# Patient Record
Sex: Male | Born: 1935 | Race: White | Hispanic: No | Marital: Married | Smoking: Never smoker
Health system: Southern US, Community
[De-identification: ages and names within clinical notes are randomized; demographics above are authoritative.]

## PROBLEM LIST (undated history)

## (undated) DIAGNOSIS — H409 Unspecified glaucoma: Secondary | ICD-10-CM

## (undated) DIAGNOSIS — M1990 Unspecified osteoarthritis, unspecified site: Secondary | ICD-10-CM

## (undated) DIAGNOSIS — D649 Anemia, unspecified: Secondary | ICD-10-CM

## (undated) DIAGNOSIS — C4490 Unspecified malignant neoplasm of skin, unspecified: Secondary | ICD-10-CM

## (undated) HISTORY — PX: SKIN SURGERY: SHX2413

## (undated) HISTORY — DX: Unspecified osteoarthritis, unspecified site: M19.90

## (undated) HISTORY — DX: Anemia, unspecified: D64.9

## (undated) HISTORY — DX: Unspecified glaucoma: H40.9

## (undated) HISTORY — PX: OTHER SURGICAL HISTORY: SHX169

## (undated) HISTORY — DX: Unspecified malignant neoplasm of skin, unspecified: C44.90

---

## 2015-04-22 DIAGNOSIS — Z23 Encounter for immunization: Secondary | ICD-10-CM | POA: Diagnosis not present

## 2015-05-01 DIAGNOSIS — M1711 Unilateral primary osteoarthritis, right knee: Secondary | ICD-10-CM | POA: Diagnosis not present

## 2015-05-06 DIAGNOSIS — M25561 Pain in right knee: Secondary | ICD-10-CM | POA: Diagnosis not present

## 2015-05-06 DIAGNOSIS — S8991XA Unspecified injury of right lower leg, initial encounter: Secondary | ICD-10-CM | POA: Diagnosis not present

## 2015-05-06 DIAGNOSIS — M2241 Chondromalacia patellae, right knee: Secondary | ICD-10-CM | POA: Diagnosis not present

## 2015-05-06 DIAGNOSIS — M1711 Unilateral primary osteoarthritis, right knee: Secondary | ICD-10-CM | POA: Diagnosis not present

## 2015-05-08 DIAGNOSIS — M1711 Unilateral primary osteoarthritis, right knee: Secondary | ICD-10-CM | POA: Diagnosis not present

## 2015-05-25 DIAGNOSIS — M1711 Unilateral primary osteoarthritis, right knee: Secondary | ICD-10-CM | POA: Diagnosis not present

## 2015-06-05 DIAGNOSIS — M1711 Unilateral primary osteoarthritis, right knee: Secondary | ICD-10-CM | POA: Diagnosis not present

## 2015-06-18 DIAGNOSIS — M1711 Unilateral primary osteoarthritis, right knee: Secondary | ICD-10-CM | POA: Diagnosis not present

## 2015-07-17 DIAGNOSIS — M1711 Unilateral primary osteoarthritis, right knee: Secondary | ICD-10-CM | POA: Diagnosis not present

## 2015-07-29 DIAGNOSIS — M5136 Other intervertebral disc degeneration, lumbar region: Secondary | ICD-10-CM | POA: Diagnosis not present

## 2015-07-29 DIAGNOSIS — M1612 Unilateral primary osteoarthritis, left hip: Secondary | ICD-10-CM | POA: Diagnosis not present

## 2015-07-29 DIAGNOSIS — R1030 Lower abdominal pain, unspecified: Secondary | ICD-10-CM | POA: Diagnosis not present

## 2015-07-29 DIAGNOSIS — M25552 Pain in left hip: Secondary | ICD-10-CM | POA: Diagnosis not present

## 2015-07-29 DIAGNOSIS — M5432 Sciatica, left side: Secondary | ICD-10-CM | POA: Diagnosis not present

## 2015-08-13 DIAGNOSIS — M1612 Unilateral primary osteoarthritis, left hip: Secondary | ICD-10-CM | POA: Diagnosis not present

## 2015-08-28 DIAGNOSIS — R1030 Lower abdominal pain, unspecified: Secondary | ICD-10-CM | POA: Diagnosis not present

## 2015-08-28 DIAGNOSIS — M1612 Unilateral primary osteoarthritis, left hip: Secondary | ICD-10-CM | POA: Diagnosis not present

## 2015-08-28 DIAGNOSIS — M25552 Pain in left hip: Secondary | ICD-10-CM | POA: Diagnosis not present

## 2015-08-28 DIAGNOSIS — M5136 Other intervertebral disc degeneration, lumbar region: Secondary | ICD-10-CM | POA: Diagnosis not present

## 2015-08-31 DIAGNOSIS — M1612 Unilateral primary osteoarthritis, left hip: Secondary | ICD-10-CM | POA: Diagnosis not present

## 2015-09-09 DIAGNOSIS — M542 Cervicalgia: Secondary | ICD-10-CM | POA: Diagnosis not present

## 2015-09-09 DIAGNOSIS — M25512 Pain in left shoulder: Secondary | ICD-10-CM | POA: Diagnosis not present

## 2015-09-09 DIAGNOSIS — M25511 Pain in right shoulder: Secondary | ICD-10-CM | POA: Diagnosis not present

## 2015-09-09 DIAGNOSIS — M25312 Other instability, left shoulder: Secondary | ICD-10-CM | POA: Diagnosis not present

## 2015-09-09 DIAGNOSIS — M25311 Other instability, right shoulder: Secondary | ICD-10-CM | POA: Diagnosis not present

## 2015-09-09 DIAGNOSIS — R29898 Other symptoms and signs involving the musculoskeletal system: Secondary | ICD-10-CM | POA: Diagnosis not present

## 2015-09-17 DIAGNOSIS — M25511 Pain in right shoulder: Secondary | ICD-10-CM | POA: Diagnosis not present

## 2015-09-19 DIAGNOSIS — M25312 Other instability, left shoulder: Secondary | ICD-10-CM | POA: Diagnosis not present

## 2015-09-19 DIAGNOSIS — M25311 Other instability, right shoulder: Secondary | ICD-10-CM | POA: Diagnosis not present

## 2015-09-24 DIAGNOSIS — M25312 Other instability, left shoulder: Secondary | ICD-10-CM | POA: Diagnosis not present

## 2015-09-24 DIAGNOSIS — M25511 Pain in right shoulder: Secondary | ICD-10-CM | POA: Diagnosis not present

## 2015-09-24 DIAGNOSIS — R29898 Other symptoms and signs involving the musculoskeletal system: Secondary | ICD-10-CM | POA: Diagnosis not present

## 2015-09-24 DIAGNOSIS — M25311 Other instability, right shoulder: Secondary | ICD-10-CM | POA: Diagnosis not present

## 2015-10-02 DIAGNOSIS — M25311 Other instability, right shoulder: Secondary | ICD-10-CM | POA: Diagnosis not present

## 2015-10-02 DIAGNOSIS — M25511 Pain in right shoulder: Secondary | ICD-10-CM | POA: Diagnosis not present

## 2015-10-02 DIAGNOSIS — M25312 Other instability, left shoulder: Secondary | ICD-10-CM | POA: Diagnosis not present

## 2015-10-07 DIAGNOSIS — Z01818 Encounter for other preprocedural examination: Secondary | ICD-10-CM | POA: Diagnosis not present

## 2015-10-07 DIAGNOSIS — R634 Abnormal weight loss: Secondary | ICD-10-CM | POA: Diagnosis not present

## 2015-10-07 DIAGNOSIS — K439 Ventral hernia without obstruction or gangrene: Secondary | ICD-10-CM | POA: Diagnosis not present

## 2015-10-07 DIAGNOSIS — B351 Tinea unguium: Secondary | ICD-10-CM | POA: Diagnosis not present

## 2015-10-07 DIAGNOSIS — R5381 Other malaise: Secondary | ICD-10-CM | POA: Diagnosis not present

## 2015-10-07 DIAGNOSIS — D649 Anemia, unspecified: Secondary | ICD-10-CM | POA: Diagnosis not present

## 2015-10-13 ENCOUNTER — Telehealth: Payer: Self-pay | Admitting: Cardiology

## 2015-10-13 DIAGNOSIS — R899 Unspecified abnormal finding in specimens from other organs, systems and tissues: Secondary | ICD-10-CM | POA: Diagnosis not present

## 2015-10-13 NOTE — Telephone Encounter (Signed)
Received records from [place] for appointment on 11/20/15 with Dr Percival Spanish.  Records given to [facility] (medical records) for Dr Hochrein's schedule on 11/20/15. lp

## 2015-10-30 ENCOUNTER — Ambulatory Visit (HOSPITAL_BASED_OUTPATIENT_CLINIC_OR_DEPARTMENT_OTHER): Payer: Commercial Managed Care - HMO | Admitting: Hematology and Oncology

## 2015-10-30 ENCOUNTER — Other Ambulatory Visit: Payer: Self-pay | Admitting: Hematology and Oncology

## 2015-10-30 ENCOUNTER — Encounter: Payer: Self-pay | Admitting: Hematology and Oncology

## 2015-10-30 ENCOUNTER — Telehealth: Payer: Self-pay | Admitting: Hematology and Oncology

## 2015-10-30 VITALS — BP 153/69 | HR 72 | Temp 98.5°F | Resp 18 | Ht 71.0 in | Wt 152.6 lb

## 2015-10-30 DIAGNOSIS — D638 Anemia in other chronic diseases classified elsewhere: Secondary | ICD-10-CM

## 2015-10-30 DIAGNOSIS — D72821 Monocytosis (symptomatic): Secondary | ICD-10-CM | POA: Diagnosis not present

## 2015-10-30 DIAGNOSIS — G8929 Other chronic pain: Secondary | ICD-10-CM

## 2015-10-30 DIAGNOSIS — M2550 Pain in unspecified joint: Secondary | ICD-10-CM

## 2015-10-30 DIAGNOSIS — R634 Abnormal weight loss: Secondary | ICD-10-CM | POA: Diagnosis not present

## 2015-10-30 NOTE — Telephone Encounter (Signed)
GAVE PATIENT AVS REPORT AND APPOINTMENTS FOR SEPTEMBER.  °

## 2015-11-01 ENCOUNTER — Encounter: Payer: Self-pay | Admitting: Hematology and Oncology

## 2015-11-01 DIAGNOSIS — D638 Anemia in other chronic diseases classified elsewhere: Secondary | ICD-10-CM | POA: Insufficient documentation

## 2015-11-01 DIAGNOSIS — R634 Abnormal weight loss: Secondary | ICD-10-CM | POA: Insufficient documentation

## 2015-11-01 DIAGNOSIS — G8929 Other chronic pain: Secondary | ICD-10-CM | POA: Insufficient documentation

## 2015-11-01 DIAGNOSIS — M2550 Pain in unspecified joint: Secondary | ICD-10-CM

## 2015-11-01 NOTE — Assessment & Plan Note (Addendum)
He has significant weight loss of 20 pounds over 4 months He has evidence of muscle wasting, presumably due to inactivity from recent fall I recommend frequent small meals We discussed further work up

## 2015-11-01 NOTE — Progress Notes (Signed)
[name] NOTE  Patient Care Team: Aretta Nip, MD as PCP - General (Family Medicine)  CHIEF COMPLAINTS/PURPOSE OF CONSULTATION:  Monocytosis, abnormal MRI, joint pain, thrombocytopenia  HISTORY OF PRESENTING ILLNESS:  Stephen Jones 82 y.o. male is here because of reasons above. I reviewed his outside records and collaborated history with the patient  The patient is here with his wife. He is debilitated, sitting on a wheelchair He was found to have abnormal CBC from preoperative evaluation prior to left hip surgery The patient had DJD. He was evaluated by orthopedic surgery for joint replacement According to outside records, his blood work is as follows: CBC from 08/05/14 was 5.8, hemoglobin 11.9 and platelet count 136,000 CBC from 08/18/14 was 6.9, hemoglobin 11.9 and platelet count 158,000 CBC from 10/07/15 was 10.6, hemoglobin 11.4 and platelet count 163,000. Differentials on that CBC showed 35% monocytes. No prior differentials are available for review from prior labs He recent fell and injured his shoulder. MRI shoulder dated 09/19/15 showed abnormal marrow signals He denies recent infection. The last prescription antibiotics was more than 3 months ago There is not reported symptoms of sinus congestion, cough, urinary frequency/urgency or dysuria, diarrhea, or abnormal skin rash.  No dental issues He had no prior history or diagnosis of cancer. His age appropriate screening programs are up-to-date. The patient has no prior diagnosis of autoimmune disease and was not prescribed corticosteroids related products. He reported 20 pounds weight loss in 4 months due to reduced appetite He has significant joint pain limiting his mobility The patient denies any recent signs or symptoms of bleeding such as spontaneous epistaxis, hematuria or hematochezia.    MEDICAL HISTORY:  Past Medical History  Diagnosis Date  . Glaucoma   . Anemia   . Skin cancer     basal  cell carcinoma    SURGICAL HISTORY: Past Surgical History  Procedure Laterality Date  . Cataracts    . Skin surgery      SOCIAL HISTORY: Social History   Social History  . Marital Status: Married    Spouse Name: N/A  . Number of Children: N/A  . Years of Education: N/A   Occupational History  . Not on file.   Social History Main Topics  . Smoking status: Never Smoker   . Smokeless tobacco: Never Used  . Alcohol Use: No  . Drug Use: No  . Sexual Activity: Not on file   Other Topics Concern  . Not on file   Social History Narrative    FAMILY HISTORY: Family History  Problem Relation Age of Onset  . Cancer Mother     colon cancer  . Cancer Son     hodgkin    ALLERGIES:  has no allergies on file.  MEDICATIONS:  Current Outpatient Prescriptions  Medication Sig Dispense Refill  . acetaminophen-codeine (TYLENOL #3) 300-30 MG tablet Take 1 tablet by mouth every 6 (six) hours as needed.    . dorzolamide-timolol (COSOPT) 22.3-6.8 MG/ML ophthalmic solution Place 1 drop into both eyes 2 (two) times daily.     No current facility-administered medications for this visit.    REVIEW OF SYSTEMS:   Constitutional: Denies fevers, chills or abnormal night sweats Eyes: Denies blurriness of vision, double vision or watery eyes Ears, nose, mouth, throat, and face: Denies mucositis or sore throat Respiratory: Denies cough, dyspnea or wheezes Cardiovascular: Denies palpitation, chest discomfort or lower extremity swelling Gastrointestinal:  Denies nausea, heartburn or change in bowel habits Skin: Denies  abnormal skin rashes Lymphatics: Denies new lymphadenopathy or easy bruising Neurological:Denies numbness, tingling. He has weakness with falls and chronic right knee, left hip and should pain Behavioral/Psych: Mood is stable, no new changes  All other systems were reviewed with the patient and are negative.  PHYSICAL EXAMINATION: ECOG PERFORMANCE STATUS: 2 - Symptomatic,  <50% confined to bed  Filed Vitals:   10/30/15 1351  BP: 153/69  Pulse: 72  Temp: 98.5 F (36.9 C)  Resp: 18   Filed Weights   10/30/15 1351  Weight: 152 lb 9.6 oz (69.219 kg)    GENERAL:alert, no distress and comfortable. Limited exam because he is on a wheelchair SKIN: skin color, texture, turgor are normal, no rashes or significant lesions EYES: normal, conjunctiva are pink and non-injected, sclera clear OROPHARYNX:no exudate, no erythema and lips, buccal mucosa, and tongue normal  NECK: supple, thyroid normal size, non-tender, without nodularity LYMPH:  no palpable lymphadenopathy in the cervical, axillary or inguinal LUNGS: clear to auscultation and percussion with normal breathing effort HEART: regular rate & rhythm and no murmurs and no lower extremity edema ABDOMEN:abdomen soft, non-tender and normal bowel sounds. No palpable splenomegaly Musculoskeletal:no cyanosis of digits and no clubbing. He has signs of muscle wasting PSYCH: alert & oriented x 3 with fluent speech NEURO: no focal motor/sensory deficits  LABORATORY DATA:  I have reviewed the data as listed   RADIOGRAPHIC STUDIES: I reviewed the outside MRI report  ASSESSMENT & PLAN  Monocytosis We had extensive discussions Even though he has no elevation of white count, he has chronic monocytosis and mild intermittent thrombocytopenia Recent MRI is suspicious for abnormal bone marrow activity We discussed the pros and cons of pursuing additional work up now including a bone marrow biopsy The patient is interested only at this point to obtain clearance to undergo elective left hip replacement surgery We discussed possible differential diagnosis including CMML given the monocytosis. Other disease process is also possible Assuming the patient has undiagnosed CMML, there is no contraindication for him to proceed with surgery. Ultimately, he declined work-up now and will return in 2 months after his surgery for  further evaluation  Abnormal weight loss He has significant weight loss of 20 pounds over 4 months He has evidence of muscle wasting, presumably due to inactivity from recent fall I recommend frequent small meals We discussed further work up   Chronic pain of multiple joints He has significant degenerative joint disease I recommend vitamin D supplement He is participating with PT  Anemia in chronic illness This is likely anemia of chronic disease. The patient denies recent history of bleeding such as epistaxis, hematuria or hematochezia. He is asymptomatic from the anemia. We will observe for now.  He does not require transfusion now.      Orders Placed This Encounter  Procedures  . CBC & Diff and Retic    Standing Status: Future     Number of Occurrences:      Standing Expiration Date: 12/03/2016  . Comprehensive metabolic panel    Standing Status: Future     Number of Occurrences:      Standing Expiration Date: 12/03/2016  . Lactate dehydrogenase (LDH)    Standing Status: Future     Number of Occurrences:      Standing Expiration Date: 12/03/2016  . Flow Cytometry    CMML    Standing Status: Future     Number of Occurrences:      Standing Expiration Date: 12/03/2016    All  questions were answered. The patient knows to call the clinic with any problems, questions or concerns. I spent 40 minutes counseling the patient face to face. The total time spent in the appointment was 50 minutes and more than 50% was on counseling.     [facility], Shamir Sedlar, MD 11/01/2015 5:05 PM

## 2015-11-01 NOTE — Assessment & Plan Note (Signed)
He has significant degenerative joint disease I recommend vitamin D supplement He is participating with PT

## 2015-11-01 NOTE — Assessment & Plan Note (Signed)
This is likely anemia of chronic disease. The patient denies recent history of bleeding such as epistaxis, hematuria or hematochezia. He is asymptomatic from the anemia. We will observe for now.  He does not require transfusion now.   

## 2015-11-01 NOTE — Assessment & Plan Note (Addendum)
We had extensive discussions Even though he has no elevation of white count, he has chronic monocytosis and mild intermittent thrombocytopenia Recent MRI is suspicious for abnormal bone marrow activity We discussed the pros and cons of pursuing additional work up now including a bone marrow biopsy The patient is interested only at this point to obtain clearance to undergo elective left hip replacement surgery We discussed possible differential diagnosis including CMML given the monocytosis. Other disease process is also possible Assuming the patient has undiagnosed CMML, there is no contraindication for him to proceed with surgery. Ultimately, he declined work-up now and will return in 2 months after his surgery for further evaluation

## 2015-11-06 ENCOUNTER — Emergency Department (HOSPITAL_BASED_OUTPATIENT_CLINIC_OR_DEPARTMENT_OTHER)
Admit: 2015-11-06 | Discharge: 2015-11-06 | Disposition: A | Discharge status: Home / Self Care | Payer: Commercial Managed Care - HMO

## 2015-11-06 ENCOUNTER — Encounter (HOSPITAL_COMMUNITY): Payer: Self-pay | Admitting: Emergency Medicine

## 2015-11-06 ENCOUNTER — Emergency Department (HOSPITAL_COMMUNITY)
Admission: EM | Admit: 2015-11-06 | Discharge: 2015-11-06 | Disposition: A | Discharge status: Home / Self Care | Payer: Commercial Managed Care - HMO | Attending: Emergency Medicine | Admitting: Emergency Medicine

## 2015-11-06 DIAGNOSIS — R2242 Localized swelling, mass and lump, left lower limb: Secondary | ICD-10-CM | POA: Diagnosis not present

## 2015-11-06 DIAGNOSIS — L03116 Cellulitis of left lower limb: Secondary | ICD-10-CM | POA: Diagnosis not present

## 2015-11-06 DIAGNOSIS — Z85828 Personal history of other malignant neoplasm of skin: Secondary | ICD-10-CM | POA: Diagnosis not present

## 2015-11-06 DIAGNOSIS — M7989 Other specified soft tissue disorders: Secondary | ICD-10-CM | POA: Diagnosis not present

## 2015-11-06 DIAGNOSIS — L0390 Cellulitis, unspecified: Secondary | ICD-10-CM | POA: Diagnosis not present

## 2015-11-06 DIAGNOSIS — Z79899 Other long term (current) drug therapy: Secondary | ICD-10-CM | POA: Diagnosis not present

## 2015-11-06 DIAGNOSIS — M25472 Effusion, left ankle: Secondary | ICD-10-CM

## 2015-11-06 NOTE — ED Notes (Signed)
Per pt, states left ankle swelling and redness-states started a couple of days ago-saw PCP and sent her for lower extremity US-no SOB

## 2015-11-06 NOTE — ED Notes (Signed)
Bed: [id number] Expected date:  Expected time:  Means of arrival:  Comments: Hold for [place]

## 2015-11-06 NOTE — Progress Notes (Signed)
Preliminary results by tech - Left Lower Ext. Venous Duplex Completed. No evidence of deep vein thrombosis in the left leg. A complex cystic structure was noted in the proximal medial calf area measuring approximately 4.3 x 3.1 cm. Oda Cogan, BS, RDMS, RVT

## 2015-11-06 NOTE — ED Provider Notes (Signed)
CSN: [id number]     Arrival date & time 11/06/15  1617 History   First MD Initiated Contact with Patient 11/06/15 1722     Chief Complaint  Patient presents with  . Foot Swelling     (Consider location/radiation/quality/duration/timing/severity/associated sxs/prior Treatment) HPI   3 days ago noticed swelling to left foot, top of foot and outside of the ankle, late4ral side of ankle. Put some ice packs on it and it went down, then developed increased swelling again and yesterday developed redness of medial ankle, some pain in calf, redness of ankle with tiny bit of pain, normal ROM of ankle.  No injuries. Waiting for hip replacement surgery--using walker but recently more shuffling then walking, doing some PT, putting weights on leg and lifting.  No fevers or chills over last few days.  No chest pain or shortness of breath.  No hx htn, CHF, DVT.  No long trips.   Past Medical History  Diagnosis Date  . Glaucoma   . Anemia   . Skin cancer     basal cell carcinoma   Past Surgical History  Procedure Laterality Date  . Cataracts    . Skin surgery     Family History  Problem Relation Age of Onset  . Cancer Mother     colon cancer  . Cancer Son     hodgkin   Social History  Substance Use Topics  . Smoking status: Never Smoker   . Smokeless tobacco: Never Used  . Alcohol Use: No    Review of Systems  Constitutional: Negative for fever.  HENT: Negative for congestion and sore throat.   Eyes: Negative for visual disturbance.  Respiratory: Negative for cough and shortness of breath.   Cardiovascular: Positive for leg swelling. Negative for chest pain.  Gastrointestinal: Negative for nausea, vomiting and abdominal pain.  Genitourinary: Negative for difficulty urinating.  Musculoskeletal: Positive for myalgias and arthralgias. Negative for back pain and neck stiffness.  Skin: Positive for rash.  Neurological: Negative for syncope and headaches.      Allergies  Review of  patient's allergies indicates no known allergies.  Home Medications   Prior to Admission medications   Medication Sig Start Date End Date Taking? Authorizing Provider  Cholecalciferol (VITAMIN D) 2000 units tablet Take 2,000 Units by mouth daily.   Yes Historical Provider, MD  dorzolamide-timolol (COSOPT) 22.3-6.8 MG/ML ophthalmic solution Place 1 drop into both eyes 2 (two) times daily. 10/14/15  Yes Historical Provider, MD  latanoprost (XALATAN) 0.005 % ophthalmic solution Place 1 drop into both eyes daily.   Yes Historical Provider, MD  Misc Natural Products (PROSTATE) CAPS Take 1 capsule by mouth daily.   Yes Historical Provider, MD  Multiple Vitamins-Minerals (MULTIVITAMIN & MINERAL PO) Take 1 tablet by mouth daily.   Yes Historical Provider, MD  timolol (BETIMOL) 0.5 % ophthalmic solution Place 1 drop into both eyes 2 (two) times daily.   Yes Historical Provider, MD  traMADol (ULTRAM) 50 MG tablet Take 50 mg by mouth every 6 (six) hours as needed for severe pain.  08/28/15   Historical Provider, MD   BP 130/91 mmHg  Pulse 79  Temp(Src) 97.5 F (36.4 C) (Oral)  Resp 16  SpO2 100% Physical Exam  Constitutional: He is oriented to person, place, and time. He appears well-developed and well-nourished. No distress.  HENT:  Head: Normocephalic and atraumatic.  Eyes: Conjunctivae and EOM are normal.  Neck: Normal range of motion.  Cardiovascular: Normal rate, regular rhythm, normal heart  sounds and intact distal pulses.  Exam reveals no gallop and no friction rub.   No murmur heard. Pulmonary/Chest: Effort normal and breath sounds normal. No respiratory distress. He has no wheezes. He has no rales.  Abdominal: Soft. He exhibits no distension. There is no tenderness. There is no guarding.  Musculoskeletal:       Right ankle: He exhibits normal pulse.       Left ankle: He exhibits swelling. He exhibits normal range of motion, no ecchymosis and normal pulse. Tenderness (mild just superior to  medial malleolus).       Left lower leg: He exhibits swelling and edema. He exhibits no deformity and no laceration.  Erythema to medial ankle, no fluctuance  Neurological: He is alert and oriented to person, place, and time.  Skin: Skin is warm and dry. He is not diaphoretic.  Nursing note and vitals reviewed.   ED Course  Procedures (including critical care time) Labs Review Labs Reviewed - No data to display  Imaging Review No results found. I have personally reviewed and evaluated these images and lab results as part of my medical decision-making.   EKG Interpretation None      MDM   Final diagnoses:  Left ankle swelling  Cellulitis of left lower extremity   82 year old male with history of glaucoma, anemia, basal cell skin cancer presents with concern for left ankle swelling. Patient denies any trauma, low suspicion for fracture. He has strong PT and DP pulses and have low suspicion for acute arterial occlusion. He has full range of motion of the ankle, and doubt septic arthritis. DVT ultrasound was ordered which showed no sign of acute DVT. He does have erythema of the medial side of his ankle, concerning for an early cellulitis, and was given a prescription for doxycycline and his PCPs office earlier today.  Recommend continuing PID doxycycline as prescribed by PCP, and following up closely.  Patient did have complex cyst to his medial proximal calf. Awaiting final radiology read, and recommended very close follow-up with PCP regarding this, as well as possible outpatient imaging and/or surgical consult.  Discussed reasons to return to the ED in detail. Do not feel labs would change course of care given hemodynamic stability, well appearance.     Gareth Morgan, MD 11/07/15 [phone]

## 2015-11-06 NOTE — Discharge Instructions (Signed)

## 2015-11-16 DIAGNOSIS — L03116 Cellulitis of left lower limb: Secondary | ICD-10-CM | POA: Diagnosis not present

## 2015-11-19 NOTE — Progress Notes (Signed)
Cardiology Office Note   Date:  11/20/2015   ID:  Stephen Jones, DOB 09-29-1928, MRN [id number]  PCP:  Aretta Nip, MD  Cardiologist:   Minus Breeding, MD   Chief Complaint  Patient presents with  . Abnormal ECG      History of Present Illness: Stephen Jones is a 82 y.o. male who presents for preoperative evaluation prior to left total hip replacement. He's had no past cardiac history. However, he does have some abnormalities on his EKG with questionable left ventricular hypertrophy. He is limited because of his hip pain prior to this knee pain. However, with  activity such as walking through his house walker denies any cardiovascular symptoms.  The patient denies any new symptoms such as chest discomfort, neck or arm discomfort. There has been no new shortness of breath, PND or orthopnea. There have been no reported palpitations, presyncope or syncope.   Past Medical History:  Diagnosis Date  . Anemia   . Glaucoma   . Osteoarthritis   . Skin cancer    basal cell carcinoma    Past Surgical History:  Procedure Laterality Date  . cataracts    . SKIN SURGERY       Current Outpatient Prescriptions  Medication Sig Dispense Refill  . Cholecalciferol (VITAMIN D) 2000 units tablet Take 2,000 Units by mouth daily.    . dorzolamide-timolol (COSOPT) 22.3-6.8 MG/ML ophthalmic solution Place 1 drop into both eyes 2 (two) times daily.    Marland Kitchen latanoprost (XALATAN) 0.005 % ophthalmic solution Place 1 drop into both eyes daily.    . Misc Natural Products (PROSTATE) CAPS Take 1 capsule by mouth daily.    . Multiple Vitamins-Minerals (MULTIVITAMIN & MINERAL PO) Take 1 tablet by mouth daily.     No current facility-administered medications for this visit.     Allergies:   Review of patient's allergies indicates no known allergies.    Social History:  The patient  reports that he has never smoked. He has never used smokeless tobacco. He reports that he does not drink alcohol or use  drugs.   Family History:  The patient's  family history includes Cancer in his mother and son.    ROS:  Please see the history of present illness.   Otherwise, review of systems are positive for none.   All other systems are reviewed and negative.    PHYSICAL EXAM: VS:  BP 130/80   Pulse 78   Ht 5\' 11"  (1.803 m)   Wt 155 lb 12.8 oz (70.7 kg)   BMI 21.73 kg/m  , BMI Body mass index is 21.73 kg/m. GENERAL:  Well appearing HEENT:  Pupils equal round and reactive, fundi not visualized, oral mucosa unremarkable NECK:  No jugular venous distention, waveform within normal limits, carotid upstroke brisk and symmetric, no bruits, no thyromegaly LYMPHATICS:  No cervical, inguinal adenopathy LUNGS:  Clear to auscultation bilaterally BACK:  No CVA tenderness CHEST:  Unremarkable HEART:  PMI  is prominent but he has a thin rib cage and he is not sustained,S1 and S2 within normal limits, no S3, no S4, no clicks, no rub2 out of 6 apical very brief systolic murmur murmurs ABD:  Flat, positive bowel sounds normal in frequency in pitch, no bruits, no rebound, no guarding, no midline pulsatile mass, no hepatomegaly, no splenomegaly EXT:  2 plus pulses throughout, no edema, no cyanosis no clubbing SKIN:  No rashes no nodules NEURO:  Cranial nerves II through XII grossly intact, motor  grossly intact throughout [facility]:  Cognitively intact, oriented to person place and time    EKG:  EKG is ordered today. The ekg ordered today demnormal sinus rhythm, rate 78, axis within normal limits, intervals within normal limits, premature atrial contractions.   Recent Labs: No results found for requested labs within last 8760 hours.    Lipid Panel No results found for: CHOL, TRIG, HDL, CHOLHDL, VLDL, LDLCALC, LDLDIRECT    Wt Readings from Last 3 Encounters:  11/20/15 155 lb 12.8 oz (70.7 kg)  10/30/15 152 lb 9.6 oz (69.2 kg)      Other studies Reviewed: Additional studies/ records that were reviewed  today include: Office records. Review of the above records demonstrates:  Please see elsewhere in the note.     ASSESSMENT AND PLAN:  PREOP:  The patient has no high-risk findings. He has no symptoms. He is not going for a high-risk procedure cardiovascular standpoint. Therefore, based on ACC/AHA guidelines, the patient would be at acceptable risk for the planned procedure without further cardiovascular testing.  ABNORMAL EKG:  His EKG likely reflects cyst is thin rib cage as does his somewhat prominent PMI. I don't strongly suspect LVH and he has no symptoms of cardiomyopathy. I will suggest a BNP level and this is normal no further workup.   Current medicines are reviewed at length with the patient today.  The patient does not have concerns regarding medicines.  The following changes have been made:  no change  Labs/ tests ordered today include:   Orders Placed This Encounter  Procedures  . B Nat Peptide  . EKG 12-Lead     Disposition:   FU with me as needed    Signed, Minus Breeding, MD  11/20/2015 2:51 PM    [facility]

## 2015-11-20 ENCOUNTER — Encounter: Payer: Self-pay | Admitting: Cardiology

## 2015-11-20 ENCOUNTER — Ambulatory Visit (INDEPENDENT_AMBULATORY_CARE_PROVIDER_SITE_OTHER): Payer: Commercial Managed Care - HMO | Admitting: Cardiology

## 2015-11-20 VITALS — BP 130/80 | HR 78 | Ht 71.0 in | Wt 155.8 lb

## 2015-11-20 DIAGNOSIS — Z01810 Encounter for preprocedural cardiovascular examination: Secondary | ICD-10-CM

## 2015-11-20 DIAGNOSIS — R9431 Abnormal electrocardiogram [ECG] [EKG]: Secondary | ICD-10-CM | POA: Diagnosis not present

## 2015-11-20 NOTE — Patient Instructions (Signed)
Medication Instructions:  Continue current medications  Labwork: BNP  Testing/Procedures: NONE  Follow-Up: Your physician recommends that you schedule a follow-up appointment in: As Needed   Any Other Special Instructions Will Be Listed Below (If Applicable).     If you need a refill on your cardiac medications before your next appointment, please call your pharmacy.

## 2015-11-21 LAB — BRAIN NATRIURETIC PEPTIDE: BRAIN NATRIURETIC PEPTIDE: 165.6 pg/mL — AB (ref <100)

## 2015-11-23 ENCOUNTER — Telehealth: Payer: Self-pay | Admitting: *Deleted

## 2015-11-23 DIAGNOSIS — R0602 Shortness of breath: Secondary | ICD-10-CM

## 2015-11-23 NOTE — Telephone Encounter (Signed)
Pt ware of his blood work, Echo was ordered and send to scheduler to be schedule

## 2015-11-23 NOTE — Telephone Encounter (Signed)
-----   Message from Minus Breeding, MD sent at 11/22/2015 11:06 AM EDT ----- His BNP is mildly elevated.  I would like to check an echocardiogram.  Call Mr. Blanchette with the results and send results to Aretta Nip, MD

## 2015-11-25 DIAGNOSIS — L0390 Cellulitis, unspecified: Secondary | ICD-10-CM | POA: Diagnosis not present

## 2015-11-26 ENCOUNTER — Other Ambulatory Visit: Payer: Self-pay

## 2015-11-26 ENCOUNTER — Ambulatory Visit (HOSPITAL_COMMUNITY): Payer: Commercial Managed Care - HMO | Attending: Cardiovascular Disease

## 2015-11-26 DIAGNOSIS — R0602 Shortness of breath: Secondary | ICD-10-CM

## 2015-11-26 DIAGNOSIS — I340 Nonrheumatic mitral (valve) insufficiency: Secondary | ICD-10-CM | POA: Diagnosis not present

## 2015-11-26 DIAGNOSIS — I517 Cardiomegaly: Secondary | ICD-10-CM | POA: Diagnosis not present

## 2015-11-26 DIAGNOSIS — R0600 Dyspnea, unspecified: Secondary | ICD-10-CM | POA: Diagnosis present

## 2015-12-09 ENCOUNTER — Telehealth: Payer: Self-pay | Admitting: Cardiology

## 2015-12-09 DIAGNOSIS — L0390 Cellulitis, unspecified: Secondary | ICD-10-CM | POA: Diagnosis not present

## 2015-12-09 NOTE — Telephone Encounter (Signed)
Will forward the last office note to the number provided.

## 2015-12-09 NOTE — Telephone Encounter (Signed)
New message      [facility] called about a letter of medical clearance. Please fax to the number below.   Request for surgical clearance:  1. What type of surgery is being performed? Total hip replacement  2. When is this surgery scheduled? Not sure  3. Are there any medications that need to be held prior to surgery and how long? No sure  4. Name of physician performing surgery?Dr. Radene Ou  5. What is your office phone and fax number? [phone], [phone]

## 2015-12-14 ENCOUNTER — Telehealth: Payer: Self-pay | Admitting: Cardiology

## 2015-12-14 DIAGNOSIS — R509 Fever, unspecified: Secondary | ICD-10-CM | POA: Diagnosis not present

## 2015-12-14 NOTE — Telephone Encounter (Signed)
Returned call to Opal Sidles from [facility] aware that surgical clearance was faxed on 8/23.   Will call back if unable to locate.

## 2015-12-14 NOTE — Telephone Encounter (Signed)
New message  Opal Sidles from [employer] call requesting to speak with RN to f/u on surgical clearance for pt please call back to discuss

## 2015-12-24 ENCOUNTER — Telehealth: Payer: Self-pay | Admitting: *Deleted

## 2015-12-24 ENCOUNTER — Ambulatory Visit (HOSPITAL_BASED_OUTPATIENT_CLINIC_OR_DEPARTMENT_OTHER): Payer: Commercial Managed Care - HMO | Admitting: Hematology and Oncology

## 2015-12-24 ENCOUNTER — Telehealth: Payer: Self-pay | Admitting: Hematology and Oncology

## 2015-12-24 ENCOUNTER — Other Ambulatory Visit (HOSPITAL_BASED_OUTPATIENT_CLINIC_OR_DEPARTMENT_OTHER): Payer: Commercial Managed Care - HMO

## 2015-12-24 ENCOUNTER — Encounter: Payer: Self-pay | Admitting: Hematology and Oncology

## 2015-12-24 DIAGNOSIS — D72821 Monocytosis (symptomatic): Secondary | ICD-10-CM | POA: Diagnosis not present

## 2015-12-24 DIAGNOSIS — D638 Anemia in other chronic diseases classified elsewhere: Secondary | ICD-10-CM | POA: Diagnosis not present

## 2015-12-24 DIAGNOSIS — A689 Relapsing fever, unspecified: Secondary | ICD-10-CM | POA: Diagnosis not present

## 2015-12-24 LAB — TECHNOLOGIST REVIEW

## 2015-12-24 LAB — CBC & DIFF AND RETIC
BASO%: 0.4 % (ref 0.0–2.0)
Basophils Absolute: 0 10*3/uL (ref 0.0–0.1)
EOS ABS: 0 10*3/uL (ref 0.0–0.5)
EOS%: 0.3 % (ref 0.0–7.0)
HCT: 27.5 % — ABNORMAL LOW (ref 38.4–49.9)
HEMOGLOBIN: 8.8 g/dL — AB (ref 13.0–17.1)
Immature Retic Fract: 6.4 % (ref 3.00–10.60)
LYMPH#: 1 10*3/uL (ref 0.9–3.3)
LYMPH%: 9.9 % — ABNORMAL LOW (ref 14.0–49.0)
MCH: 27.3 pg (ref 27.2–33.4)
MCHC: 32.2 g/dL (ref 32.0–36.0)
MCV: 84.8 fL (ref 79.3–98.0)
MONO#: 5.7 10*3/uL — ABNORMAL HIGH (ref 0.1–0.9)
MONO%: 58.1 % — AB (ref 0.0–14.0)
NEUT%: 31.3 % — ABNORMAL LOW (ref 39.0–75.0)
NEUTROS ABS: 3 10*3/uL (ref 1.5–6.5)
Platelets: 230 10*3/uL (ref 140–400)
RBC: 3.24 10*6/uL — ABNORMAL LOW (ref 4.20–5.82)
RDW: 20.5 % — AB (ref 11.0–14.6)
RETIC %: 2.58 % — AB (ref 0.80–1.80)
RETIC CT ABS: 83.59 10*3/uL (ref 34.80–93.90)
WBC: 9.7 10*3/uL (ref 4.0–10.3)

## 2015-12-24 LAB — LACTATE DEHYDROGENASE: LDH: 230 U/L (ref 125–245)

## 2015-12-24 LAB — COMPREHENSIVE METABOLIC PANEL
ALT: 32 U/L (ref 0–55)
AST: 36 U/L — AB (ref 5–34)
Albumin: 3 g/dL — ABNORMAL LOW (ref 3.5–5.0)
Alkaline Phosphatase: 72 U/L (ref 40–150)
Anion Gap: 9 mEq/L (ref 3–11)
BUN: 21.6 mg/dL (ref 7.0–26.0)
CHLORIDE: 107 meq/L (ref 98–109)
CO2: 23 mEq/L (ref 22–29)
Calcium: 9.5 mg/dL (ref 8.4–10.4)
Creatinine: 0.8 mg/dL (ref 0.7–1.3)
EGFR: 80 mL/min/{1.73_m2} — ABNORMAL LOW (ref >90)
GLUCOSE: 124 mg/dL (ref 70–140)
POTASSIUM: 3.8 meq/L (ref 3.5–5.1)
SODIUM: 139 meq/L (ref 136–145)
Total Bilirubin: 0.63 mg/dL (ref 0.20–1.20)
Total Protein: 7 g/dL (ref 6.4–8.3)

## 2015-12-24 NOTE — Assessment & Plan Note (Signed)
He has recurrent, nonspecific fever with no localizing signs of infection. I suspect this could be due to tumor fever. As above, we will proceed with bone marrow aspirate and biopsy

## 2015-12-24 NOTE — Assessment & Plan Note (Signed)
He has significant drop in hemoglobin. He is asymptomatic. I recommend bone marrow biopsy for evaluation. Today, he does not need blood transfusion

## 2015-12-24 NOTE — Telephone Encounter (Signed)
Avs report and  appt schd given per  12/24/15 los °

## 2015-12-24 NOTE — Telephone Encounter (Signed)
Per desk RN I  Have scheduled appt for tomorrow. Desk RN will call the patient

## 2015-12-24 NOTE — Progress Notes (Signed)
[place] OFFICE PROGRESS NOTE  Patient Care Team: Aretta Nip, MD as PCP - General (Family Medicine)  SUMMARY OF ONCOLOGIC HISTORY:  Kinneth Fujiwara 82 y.o. male is here because of reasons above. I reviewed his outside records and collaborated history with the patient  The patient is here with his wife. He is debilitated, sitting on a wheelchair He was found to have abnormal CBC from preoperative evaluation prior to left hip surgery The patient had DJD. He was evaluated by orthopedic surgery for joint replacement According to outside records, his blood work is as follows: CBC from 08/05/14 was 5.8, hemoglobin 11.9 and platelet count 136,000 CBC from 08/18/14 was 6.9, hemoglobin 11.9 and platelet count 158,000 CBC from 10/07/15 was 10.6, hemoglobin 11.4 and platelet count 163,000. Differentials on that CBC showed 35% monocytes. No prior differentials are available for review from prior labs He recent fell and injured his shoulder. MRI shoulder dated 09/19/15 showed abnormal marrow signals He denies recent infection. The last prescription antibiotics was more than 3 months ago There is not reported symptoms of sinus congestion, cough, urinary frequency/urgency or dysuria, diarrhea, or abnormal skin rash.  No dental issues He had no prior history or diagnosis of cancer. His age appropriate screening programs are up-to-date. The patient has no prior diagnosis of autoimmune disease and was not prescribed corticosteroids related products. He reported 20 pounds weight loss in 4 months due to reduced appetite He has significant joint pain limiting his mobility The patient denies any recent signs or symptoms of bleeding such as spontaneous epistaxis, hematuria or hematochezia. The patient was seen in July with plan for observation  INTERVAL HISTORY: Please see below for problem oriented charting. He returns for follow-up. His active to have recurrent fever in the past few weeks  with no associated night sweats. He has gained 7 pounds of weight with nutritional supplements He denies cough, sore throat, urinary frequency, urgency or dysuria, diarrhea or localizing signs of infection His fever could be as high as 102 that resolved spontaneously The patient denies any recent signs or symptoms of bleeding such as spontaneous epistaxis, hematuria or hematochezia.  REVIEW OF SYSTEMS:   Eyes: Denies blurriness of vision Ears, nose, mouth, throat, and face: Denies mucositis or sore throat Respiratory: Denies cough, dyspnea or wheezes Cardiovascular: Denies palpitation, chest discomfort or lower extremity swelling Gastrointestinal:  Denies nausea, heartburn or change in bowel habits Skin: Denies abnormal skin rashes Lymphatics: Denies new lymphadenopathy or easy bruising Neurological:Denies numbness, tingling or new weaknesses Behavioral/Psych: Mood is stable, no new changes  All other systems were reviewed with the patient and are negative.  I have reviewed the past medical history, past surgical history, social history and family history with the patient and they are unchanged from previous note.  ALLERGIES:  has No Known Allergies.  MEDICATIONS:  Current Outpatient Prescriptions  Medication Sig Dispense Refill  . Cholecalciferol (VITAMIN D) 2000 units tablet Take 2,000 Units by mouth daily.    . dorzolamide-timolol (COSOPT) 22.3-6.8 MG/ML ophthalmic solution Place 1 drop into both eyes 2 (two) times daily.    Marland Kitchen latanoprost (XALATAN) 0.005 % ophthalmic solution Place 1 drop into both eyes daily.    . Misc Natural Products (PROSTATE) CAPS Take 1 capsule by mouth daily.    . Multiple Vitamins-Minerals (MULTIVITAMIN & MINERAL PO) Take 1 tablet by mouth daily.     No current facility-administered medications for this visit.     PHYSICAL EXAMINATION: ECOG PERFORMANCE STATUS: 2 -  Symptomatic, <50% confined to bed  Vitals:   12/24/15 1301  BP: 127/72  Pulse: 84   Resp: 17  Temp: 97.9 F (36.6 C)   Filed Weights   12/24/15 1301  Weight: 160 lb (72.6 kg)    GENERAL:alert, no distress and comfortable, sitting on a wheelchair SKIN: skin color, texture, turgor are normal, no rashes or significant lesions EYES: normal, Conjunctiva are pale and non-injected, sclera clear OROPHARYNX:no exudate, no erythema and lips, buccal mucosa, and tongue normal  NECK: supple, thyroid normal size, non-tender, without nodularity LYMPH:  no palpable lymphadenopathy in the cervical, axillary or inguinal LUNGS: clear to auscultation and percussion with normal breathing effort HEART: regular rate & rhythm and no murmurs and no lower extremity edema ABDOMEN:abdomen soft, non-tender and normal bowel sounds Musculoskeletal:no cyanosis of digits and no clubbing  NEURO: alert & oriented x 3 with fluent speech, no focal motor/sensory deficits  LABORATORY DATA:  I have reviewed the data as listed    Component Value Date/Time   NA 139 12/24/2015 1230   K 3.8 12/24/2015 1230   CO2 23 12/24/2015 1230   GLUCOSE 124 12/24/2015 1230   BUN 21.6 12/24/2015 1230   CREATININE 0.8 12/24/2015 1230   CALCIUM 9.5 12/24/2015 1230   PROT 7.0 12/24/2015 1230   ALBUMIN 3.0 (L) 12/24/2015 1230   AST 36 (H) 12/24/2015 1230   ALT 32 12/24/2015 1230   ALKPHOS 72 12/24/2015 1230   BILITOT 0.63 12/24/2015 1230    No results found for: SPEP, UPEP  Lab Results  Component Value Date   WBC 9.7 12/24/2015   NEUTROABS 3.0 12/24/2015   HGB 8.8 (L) 12/24/2015   HCT 27.5 (L) 12/24/2015   MCV 84.8 12/24/2015   PLT 230 Large & giant platelets 12/24/2015      Chemistry      Component Value Date/Time   NA 139 12/24/2015 1230   K 3.8 12/24/2015 1230   CO2 23 12/24/2015 1230   BUN 21.6 12/24/2015 1230   CREATININE 0.8 12/24/2015 1230      Component Value Date/Time   CALCIUM 9.5 12/24/2015 1230   ALKPHOS 72 12/24/2015 1230   AST 36 (H) 12/24/2015 1230   ALT 32 12/24/2015 1230    BILITOT 0.63 12/24/2015 1230      I reviewed his peripheral blood smear which showed leukoerythroblastic picture ASSESSMENT & PLAN:  Monocytosis The patient had significant drop in hemoglobin with recurrent, nonspecific fever. 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 I am concerned about possible evolving myelodysplastic syndrome/leukemia I recommend urgent bone marrow aspirate and biopsy tomorrow and he agreed to proceed  Anemia in chronic illness He has significant drop in hemoglobin. He is asymptomatic. I recommend bone marrow biopsy for evaluation. Today, he does not need blood transfusion  Recurrent fever He has recurrent, nonspecific fever with no localizing signs of infection. I suspect this could be due to tumor fever. As above, we will proceed with bone marrow aspirate and biopsy   No orders of the defined types were placed in this encounter.  All questions were answered. The patient knows to call the clinic with any problems, questions or concerns. No barriers to learning was detected. I spent 20 minutes counseling the patient face to face. The total time spent in the appointment was 25 minutes and more than 50% was on counseling and review of test results     [facility], [place], MD 12/24/2015 2:42 PM

## 2015-12-24 NOTE — Assessment & Plan Note (Signed)
The patient had significant drop in hemoglobin with recurrent, nonspecific fever. 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 I am concerned about possible evolving myelodysplastic syndrome/leukemia I recommend urgent bone marrow aspirate and biopsy tomorrow and he agreed to proceed

## 2015-12-24 NOTE — Telephone Encounter (Signed)
Informed pt of BMBx is scheduled for tomorrow morning here at [facility].   Arrive a little before 7:30 am to register.  He verbalized understanding.   Butch Penny in [place] is aware. No need for additional labs tomorrow,  Pt had labs today which can be used for BMBx tomorrow.

## 2015-12-25 ENCOUNTER — Ambulatory Visit (HOSPITAL_BASED_OUTPATIENT_CLINIC_OR_DEPARTMENT_OTHER): Payer: Commercial Managed Care - HMO | Admitting: Hematology and Oncology

## 2015-12-25 ENCOUNTER — Other Ambulatory Visit: Payer: Self-pay | Admitting: Hematology and Oncology

## 2015-12-25 ENCOUNTER — Other Ambulatory Visit (HOSPITAL_COMMUNITY)
Admission: RE | Admit: 2015-12-25 | Discharge: 2015-12-25 | Disposition: A | Discharge status: Home / Self Care | Payer: Commercial Managed Care - HMO | Source: Ambulatory Visit | Attending: Hematology and Oncology | Admitting: Hematology and Oncology

## 2015-12-25 ENCOUNTER — Telehealth: Payer: Self-pay | Admitting: *Deleted

## 2015-12-25 VITALS — BP 140/80 | HR 76 | Temp 98.4°F | Resp 18

## 2015-12-25 DIAGNOSIS — D72821 Monocytosis (symptomatic): Secondary | ICD-10-CM

## 2015-12-25 DIAGNOSIS — M898X1 Other specified disorders of bone, shoulder: Secondary | ICD-10-CM

## 2015-12-25 DIAGNOSIS — D7589 Other specified diseases of blood and blood-forming organs: Secondary | ICD-10-CM | POA: Diagnosis not present

## 2015-12-25 DIAGNOSIS — D649 Anemia, unspecified: Secondary | ICD-10-CM | POA: Diagnosis not present

## 2015-12-25 LAB — BONE MARROW EXAM

## 2015-12-25 MED ORDER — OXYCODONE-ACETAMINOPHEN 5-325 MG PO TABS
ORAL_TABLET | ORAL | Status: AC
  Filled 2015-12-25: qty 1

## 2015-12-25 MED ORDER — OXYCODONE-ACETAMINOPHEN 5-325 MG PO TABS: 1.0000 | ORAL_TABLET | Freq: Four times a day (QID) | ORAL | 0 refills | Status: DC | PRN

## 2015-12-25 MED ORDER — OXYCODONE-ACETAMINOPHEN 5-325 MG PO TABS
1.0000 | ORAL_TABLET | Freq: Once | ORAL | Status: AC
Start: 2015-12-25 — End: 2015-12-25
  Administered 2015-12-25: 1 via ORAL

## 2015-12-25 NOTE — Progress Notes (Signed)
Time out preformed at 0800. First injection performed at approx 0809. BMBX began at [phone] and ended at [phone]. Pt tolerated procedure well. Pt to lay flat for 20 minutes and then can be discharged home per Dr. Alvy Bimler.   0840: Pt reeducated on discharge instructions, and adverse s/s to watch for and to notify clinic. Pt verbalizes understanding. Pt and VS stable at time of discharge, procedure site dressing remains clean, dry and intact.

## 2015-12-25 NOTE — Patient Instructions (Signed)
Bone Marrow Aspiration and Bone Marrow Biopsy Bone marrow aspiration and bone marrow biopsy are procedures that are done to diagnose blood disorders. You may also have one of these procedures to help diagnose infections or some types of cancer. Bone marrow is the soft tissue that is inside your bones. Blood cells are produced in bone marrow. For bone marrow aspiration, a sample of tissue in liquid form is removed from inside your bone. For a bone marrow biopsy, a small core of bone marrow tissue is removed. Then these samples are examined under a microscope or tested in a lab. You may need these procedures if you have an abnormal complete blood count (CBC). The aspiration or biopsy sample is usually taken from the top of your hip bone. Sometimes, an aspiration sample is taken from your chest bone (sternum). LET YOUR HEALTH CARE PROVIDER KNOW ABOUT:  Any allergies you have.  All medicines you are taking, including vitamins, herbs, eye drops, creams, and over-the-counter medicines.  Previous problems you or members of your family have had with the use of anesthetics.  Any blood disorders you have.  Previous surgeries you have had.  Any medical conditions you may have.  Whether you are pregnant or you think that you may be pregnant. RISKS AND COMPLICATIONS Generally, this is a safe procedure. However, problems may occur, including:  Infection.  Bleeding. BEFORE THE PROCEDURE  Ask your health care provider about:  Changing or stopping your regular medicines. This is especially important if you are taking diabetes medicines or blood thinners.  Taking medicines such as aspirin and ibuprofen. These medicines can thin your blood. Do not take these medicines before your procedure if your health care provider instructs you not to.  Plan to have someone take you home after the procedure.  If you go home right after the procedure, plan to have someone with you for 24 hours. PROCEDURE   An  IV tube may be inserted into one of your veins.  The injection site will be cleaned with a germ-killing solution (antiseptic).  You will be given one or more of the following:  A medicine that helps you relax (sedative).  A medicine that numbs the area (local anesthetic).  The bone marrow sample will be removed as follows:  For an aspiration, a hollow needle will be inserted through your skin and into your bone. Bone marrow fluid will be drawn up into a syringe.  For a biopsy, your health care provider will use a hollow needle to remove a core of tissue from your bone marrow.  The needle will be removed.  A bandage (dressing) will be placed over the insertion site and taped in place. The procedure may vary among health care providers and hospitals. AFTER THE PROCEDURE  Your blood pressure, heart rate, breathing rate, and blood oxygen level will be monitored often until the medicines you were given have worn off.  Return to your normal activities as directed by your health care provider.   This information is not intended to replace advice given to you by your health care provider. Make sure you discuss any questions you have with your health care provider.   Document Released: 04/07/2004 Document Revised: 08/19/2014 Document Reviewed: 03/26/2014 Elsevier Interactive Patient Education 2016 [employer]  

## 2015-12-25 NOTE — Telephone Encounter (Signed)
Clearance letter faxed to dr Radene Ou office

## 2015-12-26 NOTE — Progress Notes (Signed)
[place] OFFICE PROGRESS NOTE  Patient Care Team: Aretta Nip, MD as PCP - General (Family Medicine)  SUMMARY OF ONCOLOGIC HISTORY:  Stephen Jones 82 y.o. male is here because of reasons above. I reviewed his outside records and collaborated history with the patient  The patient is here with his wife. He is debilitated, sitting on a wheelchair He was found to have abnormal CBC from preoperative evaluation prior to left hip surgery The patient had DJD. He was evaluated by orthopedic surgery for joint replacement According to outside records, his blood work is as follows: CBC from 08/05/14 was 5.8, hemoglobin 11.9 and platelet count 136,000 CBC from 08/18/14 was 6.9, hemoglobin 11.9 and platelet count 158,000 CBC from 10/07/15 was 10.6, hemoglobin 11.4 and platelet count 163,000. Differentials on that CBC showed 35% monocytes. No prior differentials are available for review from prior labs He recent fell and injured his shoulder. MRI shoulder dated 09/19/15 showed abnormal marrow signals He denies recent infection. The last prescription antibiotics was more than 3 months ago There is not reported symptoms of sinus congestion, cough, urinary frequency/urgency or dysuria, diarrhea, or abnormal skin rash.  No dental issues He had no prior history or diagnosis of cancer. His age appropriate screening programs are up-to-date. The patient has no prior diagnosis of autoimmune disease and was not prescribed corticosteroids related products. He reported 20 pounds weight loss in 4 months due to reduced appetite He has significant joint pain limiting his mobility The patient denies any recent signs or symptoms of bleeding such as spontaneous epistaxis, hematuria or hematochezia. The patient was seen in July with plan for observation  INTERVAL HISTORY: Please see below for problem oriented charting. He returns today to proceed with bone marrow biopsy. He denies questions or new  symptoms REVIEW OF SYSTEMS:  All other systems were reviewed with the patient and are negative.  I have reviewed the past medical history, past surgical history, social history and family history with the patient and they are unchanged from previous note.  ALLERGIES:  has No Known Allergies.  MEDICATIONS:  Current Outpatient Prescriptions  Medication Sig Dispense Refill  . Cholecalciferol (VITAMIN D) 2000 units tablet Take 2,000 Units by mouth daily.    . dorzolamide-timolol (COSOPT) 22.3-6.8 MG/ML ophthalmic solution Place 1 drop into both eyes 2 (two) times daily.    Marland Kitchen latanoprost (XALATAN) 0.005 % ophthalmic solution Place 1 drop into both eyes daily.    . Misc Natural Products (PROSTATE) CAPS Take 1 capsule by mouth daily.    . Multiple Vitamins-Minerals (MULTIVITAMIN & MINERAL PO) Take 1 tablet by mouth daily.    Marland Kitchen oxyCODONE-acetaminophen (PERCOCET/ROXICET) 5-325 MG tablet Take 1 tablet by mouth every 6 (six) hours as needed for severe pain. 30 tablet 0   No current facility-administered medications for this visit.     PHYSICAL EXAMINATION: ECOG PERFORMANCE STATUS: 2 - Symptomatic, <50% confined to bed  Vitals:   12/25/15 0821 12/25/15 0835  BP: (!) 153/80 140/80  Pulse: 78 76  Resp:    Temp:  98.4 F (36.9 C)   There were no vitals filed for this visit.  GENERAL:alert, no distress and comfortable SKIN: skin color, texture, turgor are normal, no rashes or significant lesions EYES: normal, Conjunctiva are pink and non-injected, sclera clear NEURO: alert & oriented x 3 with fluent speech, no focal motor/sensory deficits  LABORATORY DATA:  I have reviewed the data as listed    Component Value Date/Time   NA 139  12/24/2015 1230   K 3.8 12/24/2015 1230   CO2 23 12/24/2015 1230   GLUCOSE 124 12/24/2015 1230   BUN 21.6 12/24/2015 1230   CREATININE 0.8 12/24/2015 1230   CALCIUM 9.5 12/24/2015 1230   PROT 7.0 12/24/2015 1230   ALBUMIN 3.0 (L) 12/24/2015 1230   AST 36  (H) 12/24/2015 1230   ALT 32 12/24/2015 1230   ALKPHOS 72 12/24/2015 1230   BILITOT 0.63 12/24/2015 1230    No results found for: SPEP, UPEP  Lab Results  Component Value Date   WBC 9.7 12/24/2015   NEUTROABS 3.0 12/24/2015   HGB 8.8 (L) 12/24/2015   HCT 27.5 (L) 12/24/2015   MCV 84.8 12/24/2015   PLT 230 Large & giant platelets 12/24/2015      Chemistry      Component Value Date/Time   NA 139 12/24/2015 1230   K 3.8 12/24/2015 1230   CO2 23 12/24/2015 1230   BUN 21.6 12/24/2015 1230   CREATININE 0.8 12/24/2015 1230      Component Value Date/Time   CALCIUM 9.5 12/24/2015 1230   ALKPHOS 72 12/24/2015 1230   AST 36 (H) 12/24/2015 1230   ALT 32 12/24/2015 1230   BILITOT 0.63 12/24/2015 1230      ASSESSMENT & PLAN:  Bone Marrow Biopsy and Aspiration Procedure Note   Informed consent was obtained and potential risks including bleeding, infection and pain were reviewed with the patient. The patient's name, date of birth, identification, consent and allergies were verified prior to the start of procedure and time out was performed.  The left posterior iliac crest was chosen as the site of biopsy.  The skin was prepped with Betadine solution.   5 cc of 1% lidocaine was used to provide local anaesthesia.   10 cc of bone marrow aspirate was obtained followed by 1 inch biopsy.   The procedure was tolerated well and there were no complications.  The patient was stable at the end of the procedure.  Specimens sent for flow cytometry, cytogenetics and additional studies.    No orders of the defined types were placed in this encounter.  All questions were answered. The patient knows to call the clinic with any problems, questions or concerns. No barriers to learning was detected. I spent 25 minutes counseling the patient face to face. The total time spent in the appointment was 30 minutes and more than 50% was on counseling and review of test results     [facility], [place],  MD 12/26/2015 2:14 PM

## 2015-12-31 ENCOUNTER — Telehealth: Payer: Self-pay | Admitting: *Deleted

## 2015-12-31 ENCOUNTER — Encounter: Payer: Self-pay | Admitting: Hematology and Oncology

## 2015-12-31 ENCOUNTER — Ambulatory Visit (HOSPITAL_BASED_OUTPATIENT_CLINIC_OR_DEPARTMENT_OTHER): Payer: Commercial Managed Care - HMO

## 2015-12-31 ENCOUNTER — Telehealth: Payer: Self-pay | Admitting: Hematology and Oncology

## 2015-12-31 ENCOUNTER — Ambulatory Visit (HOSPITAL_BASED_OUTPATIENT_CLINIC_OR_DEPARTMENT_OTHER): Payer: Commercial Managed Care - HMO | Admitting: Hematology and Oncology

## 2015-12-31 ENCOUNTER — Other Ambulatory Visit: Payer: Self-pay | Admitting: Hematology and Oncology

## 2015-12-31 VITALS — BP 137/83 | HR 86 | Temp 97.8°F | Resp 18 | Wt 158.9 lb

## 2015-12-31 DIAGNOSIS — Z23 Encounter for immunization: Secondary | ICD-10-CM | POA: Diagnosis not present

## 2015-12-31 DIAGNOSIS — Z299 Encounter for prophylactic measures, unspecified: Secondary | ICD-10-CM

## 2015-12-31 DIAGNOSIS — C9310 Chronic myelomonocytic leukemia not having achieved remission: Secondary | ICD-10-CM | POA: Diagnosis not present

## 2015-12-31 DIAGNOSIS — D72821 Monocytosis (symptomatic): Secondary | ICD-10-CM | POA: Diagnosis not present

## 2015-12-31 DIAGNOSIS — D649 Anemia, unspecified: Secondary | ICD-10-CM

## 2015-12-31 DIAGNOSIS — D539 Nutritional anemia, unspecified: Secondary | ICD-10-CM | POA: Diagnosis not present

## 2015-12-31 DIAGNOSIS — D638 Anemia in other chronic diseases classified elsewhere: Secondary | ICD-10-CM

## 2015-12-31 DIAGNOSIS — M2550 Pain in unspecified joint: Secondary | ICD-10-CM

## 2015-12-31 DIAGNOSIS — G8929 Other chronic pain: Secondary | ICD-10-CM | POA: Diagnosis not present

## 2015-12-31 DIAGNOSIS — D509 Iron deficiency anemia, unspecified: Secondary | ICD-10-CM | POA: Insufficient documentation

## 2015-12-31 LAB — TECHNOLOGIST REVIEW

## 2015-12-31 LAB — CBC & DIFF AND RETIC
BASO%: 0.1 % (ref 0.0–2.0)
Basophils Absolute: 0 10*3/uL (ref 0.0–0.1)
EOS%: 0.5 % (ref 0.0–7.0)
Eosinophils Absolute: 0 10*3/uL (ref 0.0–0.5)
HEMATOCRIT: 30.6 % — AB (ref 38.4–49.9)
HGB: 9.1 g/dL — ABNORMAL LOW (ref 13.0–17.1)
Immature Retic Fract: 7.2 % (ref 3.00–10.60)
LYMPH%: 26.6 % (ref 14.0–49.0)
MCH: 26.3 pg — AB (ref 27.2–33.4)
MCHC: 29.7 g/dL — ABNORMAL LOW (ref 32.0–36.0)
MCV: 88.4 fL (ref 79.3–98.0)
MONO#: 3 10*3/uL — ABNORMAL HIGH (ref 0.1–0.9)
MONO%: 37.6 % — AB (ref 0.0–14.0)
NEUT%: 35.2 % — ABNORMAL LOW (ref 39.0–75.0)
NEUTROS ABS: 2.8 10*3/uL (ref 1.5–6.5)
Platelets: 359 10*3/uL (ref 140–400)
RBC: 3.46 10*6/uL — AB (ref 4.20–5.82)
RDW: 19.3 % — AB (ref 11.0–14.6)
RETIC %: 2.78 % — AB (ref 0.80–1.80)
Retic Ct Abs: 96.19 10*3/uL — ABNORMAL HIGH (ref 34.80–93.90)
WBC: 7.9 10*3/uL (ref 4.0–10.3)
lymph#: 2.1 10*3/uL (ref 0.9–3.3)

## 2015-12-31 LAB — IRON AND TIBC
%SAT: 5 % — AB (ref 20–55)
IRON: 14 ug/dL — AB (ref 42–163)
TIBC: 265 ug/dL (ref 202–409)
UIBC: 251 ug/dL (ref 117–376)

## 2015-12-31 LAB — FERRITIN: Ferritin: 443 ng/ml — ABNORMAL HIGH (ref 22–316)

## 2015-12-31 MED ORDER — INFLUENZA VAC SPLIT QUAD 0.5 ML IM SUSY
0.5000 mL | PREFILLED_SYRINGE | Freq: Once | INTRAMUSCULAR | Status: AC
  Administered 2015-12-31: 0.5 mL via INTRAMUSCULAR
  Filled 2015-12-31: qty 0.5

## 2015-12-31 NOTE — Assessment & Plan Note (Signed)
This is likely anemia from the underlying bone marrow disorder. The patient denies recent history of bleeding such as epistaxis, hematuria or hematochezia. He is asymptomatic from the anemia. We will observe for now.  He does not require transfusion now. I will order additional workup and plan to start him on erythropoietin stimulating agent on 01/12/2016

## 2015-12-31 NOTE — Assessment & Plan Note (Signed)
The patient has chronic osteoarthritis and desire hip replacement surgery. I recommend he takes pain medication for now for pain control.

## 2015-12-31 NOTE — Telephone Encounter (Signed)
Informed pt of Dr. Calton Dach message regarding need for IV iron on 9/22.  He verbalized understanding.

## 2015-12-31 NOTE — Assessment & Plan Note (Signed)
We discussed the importance of preventive care and reviewed the vaccination programs. He does not have any prior allergic reactions to influenza vaccination. He agrees to proceed with influenza vaccination today and we will administer it today at the clinic.  

## 2015-12-31 NOTE — Progress Notes (Addendum)
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       INTERVAL HISTORY: Please see below for problem oriented charting. He returns to review test results of his bone marrow biopsy. He complained of hip pain and back pain. He has no recent complication from bone marrow biopsy.  REVIEW OF SYSTEMS:   Constitutional: Denies fevers, chills or abnormal weight loss Eyes: Denies blurriness of vision Ears, nose, mouth, throat, and face: Denies mucositis or sore throat Respiratory: Denies cough, dyspnea or wheezes Cardiovascular: Denies palpitation, chest discomfort or lower extremity swelling Gastrointestinal:  Denies nausea, heartburn or change in bowel habits Skin: Denies abnormal skin rashes Lymphatics: Denies new lymphadenopathy or easy bruising Neurological:Denies numbness, tingling or new weaknesses Behavioral/Psych: Mood is stable, no new changes  All other systems were reviewed with the patient and are negative.  I have reviewed the past medical history, past surgical history, social history and family history with the patient and they are unchanged from previous note.  ALLERGIES:  has No Known Allergies.  MEDICATIONS:  Current Outpatient Prescriptions  Medication Sig Dispense Refill  . Cholecalciferol (VITAMIN D) 2000 units tablet Take 2,000 Units by mouth daily.    . dorzolamide-timolol (COSOPT) 22.3-6.8 MG/ML ophthalmic solution Place 1 drop into both eyes 2 (two) times daily.    Marland Kitchen latanoprost (XALATAN) 0.005 % ophthalmic solution Place 1 drop into both eyes daily.    . Misc Natural Products (PROSTATE) CAPS Take 1 capsule by mouth daily.    . Multiple Vitamins-Minerals (MULTIVITAMIN &  MINERAL PO) Take 1 tablet by mouth daily.    Marland Kitchen oxyCODONE-acetaminophen (PERCOCET/ROXICET) 5-325 MG tablet Take 1 tablet by mouth every 6 (six) hours as needed for severe pain. 30 tablet 0   No current facility-administered medications for this visit.     PHYSICAL EXAMINATION: ECOG PERFORMANCE STATUS: 2 - Symptomatic, <50% confined to bed  Vitals:   12/31/15 1158  BP: 137/83  Pulse: 86  Resp: 18  Temp: 97.8 F (36.6 C)   Filed Weights   12/31/15 1158  Weight: 158 lb 14.4 oz (72.1 kg)    GENERAL:alert, no distress and comfortable SKIN: skin color, texture, turgor are normal, no rashes or significant lesions EYES: normal, Conjunctiva are pink and non-injected, sclera clear Musculoskeletal:no cyanosis of digits and no clubbing  NEURO: alert & oriented x 3 with fluent speech, no focal motor/sensory deficits  LABORATORY DATA:  I have reviewed the data as listed    Component Value Date/Time   NA 139 12/24/2015 1230   K 3.8 12/24/2015 1230   CO2 23 12/24/2015 1230   GLUCOSE 124 12/24/2015 1230   BUN 21.6 12/24/2015 1230   CREATININE 0.8 12/24/2015 1230   CALCIUM 9.5 12/24/2015 1230   PROT 7.0 12/24/2015 1230   ALBUMIN 3.0 (L) 12/24/2015 1230   AST 36 (H) 12/24/2015 1230   ALT 32 12/24/2015 1230   ALKPHOS 72 12/24/2015 1230   BILITOT 0.63 12/24/2015 1230    No results found for: SPEP, UPEP  Lab Results  Component Value Date   WBC 7.9 12/31/2015   NEUTROABS 2.8 12/31/2015   HGB 9.1 (L) 12/31/2015   HCT 30.6 (L) 12/31/2015   MCV 88.4 12/31/2015   PLT 359  12/31/2015      Chemistry      Component Value Date/Time   NA 139 12/24/2015 1230   K 3.8 12/24/2015 1230   CO2 23 12/24/2015 1230   BUN 21.6 12/24/2015 1230   CREATININE 0.8 12/24/2015 1230      Component Value Date/Time   CALCIUM 9.5 12/24/2015 1230   ALKPHOS 72 12/24/2015 1230   AST 36 (H) 12/24/2015 1230   ALT 32 12/24/2015 1230   BILITOT 0.63 12/24/2015 1230      ASSESSMENT & PLAN:  CMML  (chronic myelomonocytic leukemia) ([place]) I had a long discussion with the patient and his wife. We reviewed the current guidelines. Cytogenetics and FISH analysis are still pending. Based on available information, the patient has CMML-1, with calculated intermediate prognostic risk. Estimated median survival without treatment is in the region of 3 years and 25% risk of progression to AML is estimated at 3.2 years. There is no contraindication for him to proceed with elective hip surgery as discussed but I would prefer to give him a trial of ESA to improve his blood count before surgery. I will bring him back on 01/12/2016 to review all test results and decide him on first dose of ESA. He agreed with the plan of care  Deficiency anemia This is likely anemia from the underlying bone marrow disorder. The patient denies recent history of bleeding such as epistaxis, hematuria or hematochezia. He is asymptomatic from the anemia. We will observe for now.  He does not require transfusion now. I will order additional workup and plan to start him on erythropoietin stimulating agent on 01/12/2016   Chronic pain of multiple joints The patient has chronic osteoarthritis and desire hip replacement surgery. I recommend he takes pain medication for now for pain control.  Preventive measure We discussed the importance of preventive care and reviewed the vaccination programs. He does not have any prior allergic reactions to influenza vaccination. He agrees to proceed with influenza vaccination today and we will administer it today at the clinic.  After the chart is completed, it is noted at the repeat iron panel came back high ferritin with mild component of iron deficiency as well. I will bring the patient back next week for intravenous iron before we give him ESA injection  Orders Placed This Encounter  Procedures  . CBC & Diff and Retic    Standing Status:   Future    Number of Occurrences:   1     Standing Expiration Date:   02/03/2017  . Vitamin B12    Standing Status:   Future    Number of Occurrences:   1    Standing Expiration Date:   02/03/2017  . Iron and TIBC    Standing Status:   Future    Number of Occurrences:   1    Standing Expiration Date:   02/03/2017  . Ferritin    Standing Status:   Future    Number of Occurrences:   1    Standing Expiration Date:   02/03/2017  . Erythropoietin    Standing Status:   Future    Number of Occurrences:   1    Standing Expiration Date:   02/03/2017  . Folate RBC    Standing Status:   Future    Number of Occurrences:   1    Standing Expiration Date:   02/03/2017   All questions were answered. The patient knows to call the clinic with any problems, questions or  concerns. No barriers to learning was detected. I spent 25 minutes counseling the patient face to face. The total time spent in the appointment was 30 minutes and more than 50% was on counseling and review of test results     [facility], [place], MD 12/31/2015 2:05 PM

## 2015-12-31 NOTE — Assessment & Plan Note (Signed)
I had a long discussion with the patient and his wife. We reviewed the current guidelines. Cytogenetics and FISH analysis are still pending. Based on available information, the patient has CMML-1, with calculated intermediate prognostic risk. Estimated median survival without treatment is in the region of 3 years and 25% risk of progression to AML is estimated at 3.2 years. There is no contraindication for him to proceed with elective hip surgery as discussed but I would prefer to give him a trial of ESA to improve his blood count before surgery. I will bring him back on 01/12/2016 to review all test results and decide him on first dose of ESA. He agreed with the plan of care

## 2015-12-31 NOTE — Telephone Encounter (Signed)
Avs report and schedule given to patient, per 12/31/15 los. °

## 2015-12-31 NOTE — Telephone Encounter (Signed)
-----   Message from Heath Lark, MD sent at 12/31/2015  2:46 PM EDT ----- Regarding: iron study PLs call patient. Iron studies came back with low iron saturation (even though ferritin is high) I recommend 1 dose of IV iron first next Friday on 9/22 and keep his other appointments the same. If OK with him, please call Sharyn Lull to schedule 1 dose IV iron on 9/22 ----- Message ----- From: Interface, Lab In [facility] One Sent: 12/31/2015   1:24 PM To: Heath Lark, MD

## 2016-01-01 ENCOUNTER — Telehealth: Payer: Self-pay | Admitting: *Deleted

## 2016-01-01 LAB — FOLATE RBC
Folate, Hemolysate: 488.7 ng/mL
HEMATOCRIT: 29.7 % — AB (ref 37.5–51.0)

## 2016-01-01 LAB — VITAMIN B12

## 2016-01-01 LAB — ERYTHROPOIETIN: ERYTHROPOIETIN: 23.6 m[IU]/mL — AB (ref 2.6–18.5)

## 2016-01-01 NOTE — Telephone Encounter (Signed)
Per staff message from desk RN I have scheduled appt, desk RN notified

## 2016-01-01 NOTE — Telephone Encounter (Signed)
Informed pt of IV Iron appt added on 9/22 at 10:30 am.  Arrive 15 min early to check in.  He verbalized understanding.

## 2016-01-04 LAB — FLOW CYTOMETRY

## 2016-01-06 ENCOUNTER — Other Ambulatory Visit: Payer: Self-pay | Admitting: *Deleted

## 2016-01-06 LAB — CHROMOSOME ANALYSIS, BONE MARROW

## 2016-01-06 LAB — TISSUE HYBRIDIZATION (BONE MARROW)-NCBH

## 2016-01-08 ENCOUNTER — Ambulatory Visit (HOSPITAL_BASED_OUTPATIENT_CLINIC_OR_DEPARTMENT_OTHER): Payer: Commercial Managed Care - HMO

## 2016-01-08 VITALS — BP 150/84 | HR 98 | Temp 98.2°F | Resp 20

## 2016-01-08 DIAGNOSIS — D509 Iron deficiency anemia, unspecified: Secondary | ICD-10-CM

## 2016-01-08 DIAGNOSIS — C9310 Chronic myelomonocytic leukemia not having achieved remission: Secondary | ICD-10-CM

## 2016-01-08 DIAGNOSIS — D539 Nutritional anemia, unspecified: Secondary | ICD-10-CM

## 2016-01-08 MED ORDER — SODIUM CHLORIDE 0.9 % IV SOLN
510.0000 mg | Freq: Once | INTRAVENOUS | Status: AC
  Administered 2016-01-08: 510 mg via INTRAVENOUS
  Filled 2016-01-08: qty 17

## 2016-01-08 MED ORDER — SODIUM CHLORIDE 0.9 % IV SOLN
Freq: Once | INTRAVENOUS | Status: AC
  Administered 2016-01-08: 11:00:00 via INTRAVENOUS

## 2016-01-08 NOTE — Patient Instructions (Signed)

## 2016-01-12 ENCOUNTER — Other Ambulatory Visit (HOSPITAL_BASED_OUTPATIENT_CLINIC_OR_DEPARTMENT_OTHER): Payer: Commercial Managed Care - HMO

## 2016-01-12 ENCOUNTER — Encounter: Payer: Self-pay | Admitting: Hematology and Oncology

## 2016-01-12 ENCOUNTER — Telehealth: Payer: Self-pay | Admitting: Hematology and Oncology

## 2016-01-12 ENCOUNTER — Ambulatory Visit (HOSPITAL_BASED_OUTPATIENT_CLINIC_OR_DEPARTMENT_OTHER): Payer: Commercial Managed Care - HMO | Admitting: Hematology and Oncology

## 2016-01-12 ENCOUNTER — Ambulatory Visit (HOSPITAL_BASED_OUTPATIENT_CLINIC_OR_DEPARTMENT_OTHER): Payer: Commercial Managed Care - HMO

## 2016-01-12 DIAGNOSIS — D630 Anemia in neoplastic disease: Secondary | ICD-10-CM | POA: Diagnosis not present

## 2016-01-12 DIAGNOSIS — C9310 Chronic myelomonocytic leukemia not having achieved remission: Secondary | ICD-10-CM

## 2016-01-12 DIAGNOSIS — D539 Nutritional anemia, unspecified: Secondary | ICD-10-CM

## 2016-01-12 DIAGNOSIS — D509 Iron deficiency anemia, unspecified: Secondary | ICD-10-CM

## 2016-01-12 LAB — CBC & DIFF AND RETIC
BASO%: 0.1 % (ref 0.0–2.0)
Basophils Absolute: 0 10*3/uL (ref 0.0–0.1)
EOS%: 0.5 % (ref 0.0–7.0)
Eosinophils Absolute: 0.1 10*3/uL (ref 0.0–0.5)
HCT: 29.9 % — ABNORMAL LOW (ref 38.4–49.9)
HGB: 9.1 g/dL — ABNORMAL LOW (ref 13.0–17.1)
Immature Retic Fract: 9.3 % (ref 3.00–10.60)
LYMPH#: 2.7 10*3/uL (ref 0.9–3.3)
LYMPH%: 26.5 % (ref 14.0–49.0)
MCH: 26.6 pg — ABNORMAL LOW (ref 27.2–33.4)
MCHC: 30.4 g/dL — AB (ref 32.0–36.0)
MCV: 87.4 fL (ref 79.3–98.0)
MONO#: 4.1 10*3/uL — AB (ref 0.1–0.9)
MONO%: 39.8 % — ABNORMAL HIGH (ref 0.0–14.0)
NEUT%: 33.1 % — AB (ref 39.0–75.0)
NEUTROS ABS: 3.4 10*3/uL (ref 1.5–6.5)
NRBC: 0 % (ref 0–0)
Platelets: 376 10*3/uL (ref 140–400)
RBC: 3.42 10*6/uL — AB (ref 4.20–5.82)
RDW: 19.5 % — ABNORMAL HIGH (ref 11.0–14.6)
RETIC %: 3.74 % — AB (ref 0.80–1.80)
RETIC CT ABS: 127.91 10*3/uL — AB (ref 34.80–93.90)
WBC: 10.2 10*3/uL (ref 4.0–10.3)

## 2016-01-12 LAB — TECHNOLOGIST REVIEW

## 2016-01-12 LAB — FERRITIN

## 2016-01-12 MED ORDER — DARBEPOETIN ALFA 200 MCG/0.4ML IJ SOSY
200.0000 ug | PREFILLED_SYRINGE | Freq: Once | INTRAMUSCULAR | Status: AC
  Administered 2016-01-12: 200 ug via SUBCUTANEOUS
  Filled 2016-01-12: qty 0.4

## 2016-01-12 NOTE — Assessment & Plan Note (Signed)
We discussed some of the risks, benefits, and alternatives of erythropoietin stimulating agents such as Procrit or Aranesp. The patient is symptomatic from anemia and the EPO level is low. Some of the side-effects to be expected including risks of allergic reactions, skin rashes, headaches, risk of blood clots including heart attack and stroke. There is rare risks of causing growth of cancers.The patient is willing to proceed and went ahead to sign consent today.  The patient will return here every 2 weeks for blood check and darbepoetin injection. The goal is to keep hemoglobin greater than 10

## 2016-01-12 NOTE — Patient Instructions (Signed)
Darbepoetin Alfa injection What is this medicine? DARBEPOETIN ALFA (dar be POE e tin AL fa) helps your body make more red blood cells. It is used to treat anemia caused by chronic kidney failure and chemotherapy. This medicine may be used for other purposes; ask your health care provider or pharmacist if you have questions. What should I tell my health care provider before I take this medicine? They need to know if you have any of these conditions: -blood clotting disorders or history of blood clots -cancer patient not on chemotherapy -cystic fibrosis -heart disease, such as angina, heart failure, or a history of a heart attack -hemoglobin level of 12 g/dL or greater -high blood pressure -low levels of folate, iron, or vitamin B12 -seizures -an unusual or allergic reaction to darbepoetin, erythropoietin, albumin, hamster proteins, latex, other medicines, foods, dyes, or preservatives -pregnant or trying to get pregnant -breast-feeding How should I use this medicine? This medicine is for injection into a vein or under the skin. It is usually given by a health care professional in a hospital or clinic setting. If you get this medicine at home, you will be taught how to prepare and give this medicine. Do not shake the solution before you withdraw a dose. Use exactly as directed. Take your medicine at regular intervals. Do not take your medicine more often than directed. It is important that you put your used needles and syringes in a special sharps container. Do not put them in a trash can. If you do not have a sharps container, call your pharmacist or healthcare provider to get one. Talk to your pediatrician regarding the use of this medicine in children. While this medicine may be used in children as young as 1 year for selected conditions, precautions do apply. Overdosage: If you think you have taken too much of this medicine contact a poison control center or emergency room at once. NOTE:  This medicine is only for you. Do not share this medicine with others. What if I miss a dose? If you miss a dose, take it as soon as you can. If it is almost time for your next dose, take only that dose. Do not take double or extra doses. What may interact with this medicine? Do not take this medicine with any of the following medications: -epoetin alfa This list may not describe all possible interactions. Give your health care provider a list of all the medicines, herbs, non-prescription drugs, or dietary supplements you use. Also tell them if you smoke, drink alcohol, or use illegal drugs. Some items may interact with your medicine. What should I watch for while using this medicine? Visit your prescriber or health care professional for regular checks on your progress and for the needed blood tests and blood pressure measurements. It is especially important for the doctor to make sure your hemoglobin level is in the desired range, to limit the risk of potential side effects and to give you the best benefit. Keep all appointments for any recommended tests. Check your blood pressure as directed. Ask your doctor what your blood pressure should be and when you should contact him or her. As your body makes more red blood cells, you may need to take iron, folic acid, or vitamin B supplements. Ask your doctor or health care provider which products are right for you. If you have kidney disease continue dietary restrictions, even though this medication can make you feel better. Talk with your doctor or health care professional about the   foods you eat and the vitamins that you take. What side effects may I notice from receiving this medicine? Side effects that you should report to your doctor or health care professional as soon as possible: -allergic reactions like skin rash, itching or hives, swelling of the face, lips, or tongue -breathing problems -changes in vision -chest pain -confusion, trouble speaking  or understanding -feeling faint or lightheaded, falls -high blood pressure -muscle aches or pains -pain, swelling, warmth in the leg -rapid weight gain -severe headaches -sudden numbness or weakness of the face, arm or leg -trouble walking, dizziness, loss of balance or coordination -seizures (convulsions) -swelling of the ankles, feet, hands -unusually weak or tired Side effects that usually do not require medical attention (report to your doctor or health care professional if they continue or are bothersome): -diarrhea -fever, chills (flu-like symptoms) -headaches -nausea, vomiting -redness, stinging, or swelling at site where injected This list may not describe all possible side effects. Call your doctor for medical advice about side effects. You may report side effects to FDA at 1-800-FDA-1088. Where should I keep my medicine? Keep out of the reach of children. Store in a refrigerator between 2 and 8 degrees C (36 and 46 degrees F). Do not freeze. Do not shake. Throw away any unused portion if using a single-dose vial. Throw away any unused medicine after the expiration date. NOTE: This sheet is a summary. It may not cover all possible information. If you have questions about this medicine, talk to your doctor, pharmacist, or health care provider.    2016, Elsevier/Gold Standard. (2008-03-18 10:23:57)  

## 2016-01-12 NOTE — Progress Notes (Signed)
[facility] OFFICE PROGRESS NOTE  Patient Care Team: Aretta Nip, MD as PCP - General (Family Medicine)  SUMMARY OF ONCOLOGIC HISTORY: Oncology History   Calculated IPSS-R: 4 points (cytogenetics 1 point for normal; bone marrow blasts 2 points for 5%; 1 point for hemaoglobin < 10). 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 Calculated IPSS-R: 4 points (cytogenetics 1 point for normal; bone marrow blasts 2 points for 5%; 1 point for hemaoglobin < 10). 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 He returns today with his wife to review final bone marrow biopsy report. He feels well. Joint pain is stable. The patient denies any recent signs or symptoms of bleeding such as spontaneous epistaxis, hematuria or hematochezia.   REVIEW OF SYSTEMS:   Constitutional: Denies fevers, chills or abnormal weight loss Eyes: Denies blurriness of vision Ears, nose, mouth, throat, and face: Denies mucositis or sore throat Respiratory: Denies cough, dyspnea or wheezes Cardiovascular: Denies palpitation, chest discomfort or lower extremity swelling Gastrointestinal:  Denies nausea, heartburn or change in bowel habits Skin: Denies abnormal skin rashes Lymphatics: Denies new lymphadenopathy or easy bruising Neurological:Denies numbness, tingling or new weaknesses Behavioral/Psych: Mood is stable, no new changes  All other systems were reviewed  with the patient and are negative.  I have reviewed the past medical history, past surgical history, social history and family history with the patient and they are unchanged from previous note.  ALLERGIES:  has No Known Allergies.  MEDICATIONS:  Current Outpatient Prescriptions  Medication Sig Dispense Refill  . Cholecalciferol (VITAMIN D) 2000 units tablet Take 2,000 Units by mouth daily.    . dorzolamide-timolol (COSOPT) 22.3-6.8 MG/ML ophthalmic solution Place 1 drop into both eyes 2 (two) times daily.    Marland Kitchen latanoprost (XALATAN) 0.005 % ophthalmic solution Place 1 drop into both eyes daily.    . Misc Natural Products (PROSTATE) CAPS Take 1 capsule by mouth daily.    . Multiple Vitamins-Minerals (MULTIVITAMIN & MINERAL PO) Take 1 tablet by mouth daily.    Marland Kitchen oxyCODONE-acetaminophen (PERCOCET/ROXICET) 5-325 MG tablet Take 1 tablet by mouth every 6 (six) hours as needed for severe pain. 30 tablet 0   No current facility-administered medications for this visit.     PHYSICAL EXAMINATION: ECOG PERFORMANCE STATUS: 2 - Symptomatic, <50% confined to bed  Vitals:   01/12/16 1224  BP: (!) 149/72  Pulse: 74  Resp: 18  Temp: 98.3 F (36.8 C)   Filed Weights   01/12/16 1224  Weight: 158 lb (71.7 kg)    GENERAL:alert, no distress and comfortable SKIN: skin color, texture, turgor are normal, no rashes or significant lesions EYES: normal, Conjunctiva are pink and non-injected, sclera clear Musculoskeletal:no cyanosis of digits and no clubbing  NEURO: alert & oriented x 3 with fluent speech, no focal motor/sensory deficits  LABORATORY DATA:  I have  reviewed the data as listed    Component Value Date/Time   NA 139 12/24/2015 1230   K 3.8 12/24/2015 1230   CO2 23 12/24/2015 1230   GLUCOSE 124 12/24/2015 1230   BUN 21.6 12/24/2015 1230   CREATININE 0.8 12/24/2015 1230   CALCIUM 9.5 12/24/2015 1230   PROT 7.0 12/24/2015 1230   ALBUMIN 3.0 (L) 12/24/2015 1230   AST 36 (H) 12/24/2015  1230   ALT 32 12/24/2015 1230   ALKPHOS 72 12/24/2015 1230   BILITOT 0.63 12/24/2015 1230    No results found for: SPEP, UPEP  Lab Results  Component Value Date   WBC 10.2 01/12/2016   NEUTROABS 3.4 01/12/2016   HGB 9.1 (L) 01/12/2016   HCT 29.9 (L) 01/12/2016   MCV 87.4 01/12/2016   PLT 376 01/12/2016      Chemistry      Component Value Date/Time   NA 139 12/24/2015 1230   K 3.8 12/24/2015 1230   CO2 23 12/24/2015 1230   BUN 21.6 12/24/2015 1230   CREATININE 0.8 12/24/2015 1230      Component Value Date/Time   CALCIUM 9.5 12/24/2015 1230   ALKPHOS 72 12/24/2015 1230   AST 36 (H) 12/24/2015 1230   ALT 32 12/24/2015 1230   BILITOT 0.63 12/24/2015 1230      ASSESSMENT & PLAN:  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 Based on available information, the patient has CMML-1, with calculated intermediate prognostic risk. Estimated median survival without treatment is in the region of 3 years and 25% risk of progression to AML is estimated at 3.2 years. There is no contraindication for him to proceed with elective hip surgery as discussed but I would prefer to give him a trial of ESA to improve his blood count before surgery.  Anemia in neoplastic disease We discussed some of the risks, benefits, and alternatives of erythropoietin stimulating agents such as Procrit or Aranesp. The patient is symptomatic from anemia and the EPO level is low. Some of the side-effects to be expected including risks of allergic reactions, skin rashes, headaches, risk of blood clots including heart attack and stroke. There is rare risks of causing growth of cancers.The patient is willing to proceed and went ahead to sign consent today.  The patient will return here every 2 weeks for blood check and darbepoetin injection. The goal is to keep hemoglobin greater than 10   No orders of the defined types were placed in this  encounter.  All questions were answered. The patient knows to call the clinic with any problems, questions or concerns. No barriers to learning was detected. I spent 15 minutes counseling the patient face to face. The total time spent in the appointment was 20 minutes and more than 50% was on counseling and review of test results     Heath Lark, MD 01/12/2016 1:24 PM

## 2016-01-12 NOTE — Assessment & Plan Note (Signed)
I reviewed the final cytogenetics results with the patient and his wife. Based on available information, the patient has CMML-1, with calculated intermediate prognostic risk. Estimated median survival without treatment is in the region of 3 years and 25% risk of progression to AML is estimated at 3.2 years. There is no contraindication for him to proceed with elective hip surgery as discussed but I would prefer to give him a trial of ESA to improve his blood count before surgery.

## 2016-01-12 NOTE — Telephone Encounter (Signed)
Gave patient avs report and appointments for October and November.  °

## 2016-01-19 ENCOUNTER — Encounter (HOSPITAL_COMMUNITY): Payer: Self-pay

## 2016-01-26 ENCOUNTER — Other Ambulatory Visit (HOSPITAL_BASED_OUTPATIENT_CLINIC_OR_DEPARTMENT_OTHER): Payer: Commercial Managed Care - HMO

## 2016-01-26 ENCOUNTER — Ambulatory Visit: Payer: Commercial Managed Care - HMO

## 2016-01-26 DIAGNOSIS — C9310 Chronic myelomonocytic leukemia not having achieved remission: Secondary | ICD-10-CM | POA: Diagnosis not present

## 2016-01-26 DIAGNOSIS — D509 Iron deficiency anemia, unspecified: Secondary | ICD-10-CM

## 2016-01-26 LAB — CBC & DIFF AND RETIC
BASO%: 0.3 % (ref 0.0–2.0)
Basophils Absolute: 0 10*3/uL (ref 0.0–0.1)
EOS%: 0.7 % (ref 0.0–7.0)
Eosinophils Absolute: 0 10*3/uL (ref 0.0–0.5)
HCT: 32.8 % — ABNORMAL LOW (ref 38.4–49.9)
HEMOGLOBIN: 10 g/dL — AB (ref 13.0–17.1)
Immature Retic Fract: 4.9 % (ref 3.00–10.60)
LYMPH%: 19.8 % (ref 14.0–49.0)
MCH: 26.3 pg — AB (ref 27.2–33.4)
MCHC: 30.5 g/dL — AB (ref 32.0–36.0)
MCV: 86.3 fL (ref 79.3–98.0)
MONO#: 2.5 10*3/uL — ABNORMAL HIGH (ref 0.1–0.9)
MONO%: 43.1 % — AB (ref 0.0–14.0)
NEUT%: 36.1 % — ABNORMAL LOW (ref 39.0–75.0)
NEUTROS ABS: 2.1 10*3/uL (ref 1.5–6.5)
Platelets: 263 10*3/uL (ref 140–400)
RBC: 3.8 10*6/uL — AB (ref 4.20–5.82)
RDW: 19.3 % — AB (ref 11.0–14.6)
Retic %: 2.29 % — ABNORMAL HIGH (ref 0.80–1.80)
Retic Ct Abs: 87.02 10*3/uL (ref 34.80–93.90)
WBC: 5.8 10*3/uL (ref 4.0–10.3)
lymph#: 1.1 10*3/uL (ref 0.9–3.3)

## 2016-01-26 NOTE — Progress Notes (Signed)
Hgb at 10.0 today. No injection needed per Dr. Alvy Bimler. Pt given current copy of labs and schedule. Pt instructed to keep up coming appointments and call office if issues occur. Pt verbalized  Understanding of instructions.

## 2016-01-28 ENCOUNTER — Encounter: Payer: Self-pay | Admitting: Hematology and Oncology

## 2016-01-29 ENCOUNTER — Telehealth: Payer: Self-pay | Admitting: *Deleted

## 2016-01-29 NOTE — Telephone Encounter (Signed)
Request from Dr. Sid Falcon office for Medical Clearance for left hip surgery.  Faxed letter from Dr. Alvy Bimler ok from Heme standpoint.  Letter faxed to [phone].

## 2016-02-02 ENCOUNTER — Telehealth: Payer: Self-pay | Admitting: *Deleted

## 2016-02-02 NOTE — Telephone Encounter (Signed)
Stephen Jones states Dr Lyla Glassing would like for his Hgb to be at least 11 prior to surgery on 11/9. Pt will be here for labs and possible injection on 10/24.   He wants to know if Dr Alvy Bimler is OK giving him the aranesp even if he is greater than 10.

## 2016-02-02 NOTE — Progress Notes (Signed)
Surgery on 02/25/2016.  Need orders in EPIc. Thank You.

## 2016-02-02 NOTE — Telephone Encounter (Signed)
I will not be able to get it above 11 based on medicare guidelines If he wants 11, he will have to order blood transfusions

## 2016-02-03 ENCOUNTER — Ambulatory Visit: Payer: Self-pay | Admitting: Orthopedic Surgery

## 2016-02-09 ENCOUNTER — Ambulatory Visit: Payer: Commercial Managed Care - HMO

## 2016-02-09 ENCOUNTER — Other Ambulatory Visit (HOSPITAL_BASED_OUTPATIENT_CLINIC_OR_DEPARTMENT_OTHER): Payer: Commercial Managed Care - HMO

## 2016-02-09 DIAGNOSIS — D509 Iron deficiency anemia, unspecified: Secondary | ICD-10-CM

## 2016-02-09 DIAGNOSIS — C9310 Chronic myelomonocytic leukemia not having achieved remission: Secondary | ICD-10-CM | POA: Diagnosis not present

## 2016-02-09 LAB — CBC & DIFF AND RETIC
BASO%: 0.3 % (ref 0.0–2.0)
BASOS ABS: 0 10*3/uL (ref 0.0–0.1)
EOS ABS: 0.1 10*3/uL (ref 0.0–0.5)
EOS%: 1 % (ref 0.0–7.0)
HEMATOCRIT: 33.2 % — AB (ref 38.4–49.9)
HGB: 10.2 g/dL — ABNORMAL LOW (ref 13.0–17.1)
Immature Retic Fract: 3.6 % (ref 3.00–10.60)
LYMPH#: 1.1 10*3/uL (ref 0.9–3.3)
LYMPH%: 17.7 % (ref 14.0–49.0)
MCH: 26.4 pg — ABNORMAL LOW (ref 27.2–33.4)
MCHC: 30.7 g/dL — AB (ref 32.0–36.0)
MCV: 86 fL (ref 79.3–98.0)
MONO#: 2.8 10*3/uL — AB (ref 0.1–0.9)
MONO%: 46.3 % — ABNORMAL HIGH (ref 0.0–14.0)
NEUT#: 2.1 10*3/uL (ref 1.5–6.5)
NEUT%: 34.7 % — ABNORMAL LOW (ref 39.0–75.0)
Platelets: 262 10*3/uL (ref 140–400)
RBC: 3.86 10*6/uL — AB (ref 4.20–5.82)
RDW: 19.8 % — AB (ref 11.0–14.6)
RETIC %: 2.07 % — AB (ref 0.80–1.80)
RETIC CT ABS: 79.9 10*3/uL (ref 34.80–93.90)
WBC: 6.1 10*3/uL (ref 4.0–10.3)

## 2016-02-09 NOTE — Progress Notes (Signed)
Hemoglobin noted on todays lab at 10.2. No injection needed per protocol order.  Copy of current lab and current schedule given to patient and family member. Pt instructed to keep up coming appointments and to call office if issues occur. Pt verbalized understanding of instructions

## 2016-02-10 DIAGNOSIS — M1612 Unilateral primary osteoarthritis, left hip: Secondary | ICD-10-CM | POA: Diagnosis not present

## 2016-02-10 DIAGNOSIS — K4090 Unilateral inguinal hernia, without obstruction or gangrene, not specified as recurrent: Secondary | ICD-10-CM | POA: Diagnosis not present

## 2016-02-17 ENCOUNTER — Other Ambulatory Visit (HOSPITAL_COMMUNITY): Payer: Commercial Managed Care - HMO

## 2016-02-23 ENCOUNTER — Ambulatory Visit (HOSPITAL_BASED_OUTPATIENT_CLINIC_OR_DEPARTMENT_OTHER): Payer: Commercial Managed Care - HMO | Admitting: Hematology and Oncology

## 2016-02-23 ENCOUNTER — Telehealth: Payer: Self-pay | Admitting: Hematology and Oncology

## 2016-02-23 ENCOUNTER — Encounter: Payer: Self-pay | Admitting: Hematology and Oncology

## 2016-02-23 ENCOUNTER — Ambulatory Visit: Payer: Commercial Managed Care - HMO

## 2016-02-23 ENCOUNTER — Other Ambulatory Visit (HOSPITAL_BASED_OUTPATIENT_CLINIC_OR_DEPARTMENT_OTHER): Payer: Commercial Managed Care - HMO

## 2016-02-23 DIAGNOSIS — C9310 Chronic myelomonocytic leukemia not having achieved remission: Secondary | ICD-10-CM | POA: Diagnosis not present

## 2016-02-23 DIAGNOSIS — G8929 Other chronic pain: Secondary | ICD-10-CM

## 2016-02-23 DIAGNOSIS — D630 Anemia in neoplastic disease: Secondary | ICD-10-CM

## 2016-02-23 DIAGNOSIS — D509 Iron deficiency anemia, unspecified: Secondary | ICD-10-CM

## 2016-02-23 DIAGNOSIS — M2550 Pain in unspecified joint: Secondary | ICD-10-CM | POA: Diagnosis not present

## 2016-02-23 LAB — CBC & DIFF AND RETIC
BASO%: 0.2 % (ref 0.0–2.0)
BASOS ABS: 0 10*3/uL (ref 0.0–0.1)
EOS ABS: 0.1 10*3/uL (ref 0.0–0.5)
EOS%: 0.7 % (ref 0.0–7.0)
HEMATOCRIT: 35.4 % — AB (ref 38.4–49.9)
HEMOGLOBIN: 10.7 g/dL — AB (ref 13.0–17.1)
Immature Retic Fract: 9.3 % (ref 3.00–10.60)
LYMPH%: 13.7 % — ABNORMAL LOW (ref 14.0–49.0)
MCH: 26.5 pg — AB (ref 27.2–33.4)
MCHC: 30.2 g/dL — ABNORMAL LOW (ref 32.0–36.0)
MCV: 87.6 fL (ref 79.3–98.0)
MONO#: 4.4 10*3/uL — ABNORMAL HIGH (ref 0.1–0.9)
MONO%: 49.3 % — AB (ref 0.0–14.0)
NEUT#: 3.2 10*3/uL (ref 1.5–6.5)
NEUT%: 36.1 % — AB (ref 39.0–75.0)
PLATELETS: 252 10*3/uL (ref 140–400)
RBC: 4.04 10*6/uL — ABNORMAL LOW (ref 4.20–5.82)
RDW: 20.6 % — AB (ref 11.0–14.6)
RETIC %: 2.6 % — AB (ref 0.80–1.80)
RETIC CT ABS: 105.04 10*3/uL — AB (ref 34.80–93.90)
WBC: 8.9 10*3/uL (ref 4.0–10.3)
lymph#: 1.2 10*3/uL (ref 0.9–3.3)

## 2016-02-23 LAB — TECHNOLOGIST REVIEW

## 2016-02-23 NOTE — Assessment & Plan Note (Signed)
I reviewed the final cytogenetics results with the patient and his wife. Based on available information, the patient has CMML-1, with calculated intermediate prognostic risk. Estimated median survival without treatment is in the region of 3 years and 25% risk of progression to AML is estimated at 3.2 years. There is no contraindication for him to proceed with elective hip surgery as discussed

## 2016-02-23 NOTE — Assessment & Plan Note (Signed)
Unfortunately, his recent joint surgery was canceled due to hernia He will be seeing the surgeon for possible hernia repair first before hip replacement surgery There is no contraindication from my standpoint for him to proceed with hernia surgery

## 2016-02-23 NOTE — Progress Notes (Signed)
[facility] OFFICE PROGRESS NOTE  Patient Care Team: Aretta Nip, MD as PCP - General (Family Medicine)  SUMMARY OF ONCOLOGIC HISTORY: Oncology History   Calculated IPSS-R: 4 points (cytogenetics 1 point for normal; bone marrow blasts 2 points for 5%; 1 point for hemaoglobin < 10). 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 Calculated IPSS-R: 4 points (cytogenetics 1 point for normal; bone marrow blasts 2 points for 5%; 1 point for hemaoglobin < 10). 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 He returns for follow-up. He is doing well with excellent energy level He still has a lot of hip pain. His surgery is cancelled in anticipation for hernia repair first He denies recent infection The patient denies any recent signs or symptoms of bleeding such as spontaneous epistaxis, hematuria or hematochezia.  REVIEW OF SYSTEMS:   Constitutional: Denies fevers, chills or abnormal weight loss Eyes: Denies blurriness of vision Ears, nose, mouth, throat, and face: Denies mucositis or sore throat Respiratory: Denies cough, dyspnea or wheezes Cardiovascular: Denies palpitation, chest discomfort or lower extremity swelling Gastrointestinal:  Denies nausea, heartburn or change in bowel habits Skin: Denies abnormal skin rashes Lymphatics: Denies new lymphadenopathy or easy bruising Neurological:Denies numbness, tingling or new  weaknesses Behavioral/Psych: Mood is stable, no new changes  All other systems were reviewed with the patient and are negative.  I have reviewed the past medical history, past surgical history, social history and family history with the patient and they are unchanged from previous note.  ALLERGIES:  has No Known Allergies.  MEDICATIONS:  Current Outpatient Prescriptions  Medication Sig Dispense Refill  . Cholecalciferol (VITAMIN D) 2000 units tablet Take 2,000 Units by mouth daily.    . dorzolamide-timolol (COSOPT) 22.3-6.8 MG/ML ophthalmic solution Place 1 drop into both eyes 2 (two) times daily.    Marland Kitchen latanoprost (XALATAN) 0.005 % ophthalmic solution Place 1 drop into both eyes daily.    . Misc Natural Products (PROSTATE) CAPS Take 1 capsule by mouth daily.    . Multiple Vitamins-Minerals (MULTIVITAMIN & MINERAL PO) Take 1 tablet by mouth daily.    Marland Kitchen oxyCODONE-acetaminophen (PERCOCET/ROXICET) 5-325 MG tablet Take 1 tablet by mouth every 6 (six) hours as needed for severe pain. (Patient not taking: Reported on 02/23/2016) 30 tablet 0   No current facility-administered medications for this visit.     PHYSICAL EXAMINATION: ECOG PERFORMANCE STATUS: 2 - Symptomatic, <50% confined to bed  Vitals:   02/23/16 1051  BP: (!) 158/80  Pulse: 80  Resp: 18  Temp: 98.4 F (36.9 C)   Filed Weights    GENERAL:alert, no distress and comfortable SKIN: skin color, texture, turgor are normal, no rashes or significant lesions EYES: normal, Conjunctiva are pink and non-injected, sclera clear Musculoskeletal:no cyanosis of digits and no clubbing  NEURO: alert & oriented x 3 with fluent speech, no  focal motor/sensory deficits  LABORATORY DATA:  I have reviewed the data as listed    Component Value Date/Time   NA 139 12/24/2015 1230   K 3.8 12/24/2015 1230   CO2 23 12/24/2015 1230   GLUCOSE 124 12/24/2015 1230   BUN 21.6 12/24/2015 1230   CREATININE 0.8 12/24/2015 1230   CALCIUM 9.5  12/24/2015 1230   PROT 7.0 12/24/2015 1230   ALBUMIN 3.0 (L) 12/24/2015 1230   AST 36 (H) 12/24/2015 1230   ALT 32 12/24/2015 1230   ALKPHOS 72 12/24/2015 1230   BILITOT 0.63 12/24/2015 1230    No results found for: SPEP, UPEP  Lab Results  Component Value Date   WBC 8.9 02/23/2016   NEUTROABS 3.2 02/23/2016   HGB 10.7 (L) 02/23/2016   HCT 35.4 (L) 02/23/2016   MCV 87.6 02/23/2016   PLT 252 02/23/2016      Chemistry      Component Value Date/Time   NA 139 12/24/2015 1230   K 3.8 12/24/2015 1230   CO2 23 12/24/2015 1230   BUN 21.6 12/24/2015 1230   CREATININE 0.8 12/24/2015 1230      Component Value Date/Time   CALCIUM 9.5 12/24/2015 1230   ALKPHOS 72 12/24/2015 1230   AST 36 (H) 12/24/2015 1230   ALT 32 12/24/2015 1230   BILITOT 0.63 12/24/2015 1230       ASSESSMENT & PLAN:  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 Based on available information, the patient has CMML-1, with calculated intermediate prognostic risk. Estimated median survival without treatment is in the region of 3 years and 25% risk of progression to AML is estimated at 3.2 years. There is no contraindication for him to proceed with elective hip surgery as discussed  Anemia in neoplastic disease He has responded very well to recent IV iron and darbepoetin injection He has not required any injection for 6 weeks I will space out his treatment to once a month and see him back after the new year for further assessment  Chronic pain of multiple joints Unfortunately, his recent joint surgery was canceled due to hernia He will be seeing the surgeon for possible hernia repair first before hip replacement surgery There is no contraindication from my standpoint for him to proceed with hernia surgery   No orders of the defined types were placed in this encounter.  All questions were answered. The patient knows to call the  clinic with any problems, questions or concerns. No barriers to learning was detected. I spent 15 minutes counseling the patient face to face. The total time spent in the appointment was 20 minutes and more than 50% was on counseling and review of test results     Heath Lark, MD 02/23/2016 1:43 PM

## 2016-02-23 NOTE — Telephone Encounter (Signed)
Gave patient avs report and appointments for December and January  °

## 2016-02-23 NOTE — Assessment & Plan Note (Signed)
He has responded very well to recent IV iron and darbepoetin injection He has not required any injection for 6 weeks I will space out his treatment to once a month and see him back after the new year for further assessment

## 2016-02-25 ENCOUNTER — Inpatient Hospital Stay: Admit: 2016-02-25 | Payer: Commercial Managed Care - HMO | Admitting: Orthopedic Surgery

## 2016-02-25 SURGERY — ARTHROPLASTY, HIP, TOTAL, ANTERIOR APPROACH
Anesthesia: Spinal | Laterality: Left

## 2016-02-29 ENCOUNTER — Ambulatory Visit: Payer: Commercial Managed Care - HMO | Admitting: Cardiology

## 2016-03-04 DIAGNOSIS — H524 Presbyopia: Secondary | ICD-10-CM | POA: Diagnosis not present

## 2016-03-04 DIAGNOSIS — Z0100 Encounter for examination of eyes and vision without abnormal findings: Secondary | ICD-10-CM | POA: Diagnosis not present

## 2016-03-08 ENCOUNTER — Ambulatory Visit: Payer: Self-pay | Admitting: General Surgery

## 2016-03-08 ENCOUNTER — Ambulatory Visit: Payer: Commercial Managed Care - HMO

## 2016-03-08 ENCOUNTER — Other Ambulatory Visit: Payer: Commercial Managed Care - HMO

## 2016-03-08 DIAGNOSIS — K4020 Bilateral inguinal hernia, without obstruction or gangrene, not specified as recurrent: Secondary | ICD-10-CM | POA: Diagnosis not present

## 2016-03-08 NOTE — H&P (Signed)
Lion A. Baumgarner 03/08/2016 11:34 AM Location: [facility] Patient #: [id number] DOB: 01/10/1929 Married / Language: Cleophus Molt / Race: White Male  History of Present Illness Odis Hollingshead MD; 03/08/2016 12:27 PM) The patient is a 82 year old male.   Note:He is referred by Dr. Delfino Lovett for consultation regarding a left inguinal hernia. He has severe left hip pain which limits his mobility. His been recommended he have a left hip replacement. However, he has a giant left inguinal hernia which would be in the way of the left hip replacement incision potentially area he states the hernias been present for about 15 years and has been slowly getting larger. He states it causes him no pain. He denies any constipation or difficulty with urination. He uses a wheelchair earlier mostly and can walk very short distances. He also has CML and is treated by Dr. Claretha Cooper for this.  Other Problems (April Staton, [place]; 03/08/2016 11:34 AM) Arthritis Back Pain Cancer Enlarged Prostate  Past Surgical History (April Staton, [place]; 03/08/2016 11:34 AM) Cataract Surgery Bilateral.  Diagnostic Studies History (April Staton, [place]; 03/08/2016 11:34 AM) Colonoscopy never  Allergies (April Staton, CMA; 03/08/2016 11:35 AM) No Known Drug Allergies 03/08/2016  Social History (April Staton, CMA; 03/08/2016 11:34 AM) Alcohol use Moderate alcohol use. Caffeine use Coffee. No drug use Tobacco use Never smoker.  Family History (April Staton, [place]; 03/08/2016 11:34 AM) Cancer Son. Colon Cancer Mother. Diabetes Mellitus Brother. Heart Disease Brother, Father. Heart disease in male family member before age 24     Review of Systems (April Staton [place]; 03/08/2016 11:34 AM) General Present- Fatigue. Not Present- Appetite Loss, Chills, Fever, Night Sweats, Weight Gain and Weight Loss. Skin Not Present- Change in Wart/Mole, Dryness, Hives, Jaundice, New Lesions, Non-Healing Wounds,  Rash and Ulcer. HEENT Present- Wears glasses/contact lenses. Not Present- Earache, Hearing Loss, Hoarseness, Nose Bleed, Oral Ulcers, Ringing in the Ears, Seasonal Allergies, Sinus Pain, Sore Throat, Visual Disturbances and Yellow Eyes. Respiratory Not Present- Bloody sputum, Chronic Cough, Difficulty Breathing, Snoring and Wheezing. Breast Not Present- Breast Mass, Breast Pain, Nipple Discharge and Skin Changes. Cardiovascular Not Present- Chest Pain, Difficulty Breathing Lying Down, Leg Cramps, Palpitations, Rapid Heart Rate, Shortness of Breath and Swelling of Extremities. Gastrointestinal Present- Constipation. Not Present- Abdominal Pain, Bloating, Bloody Stool, Change in Bowel Habits, Chronic diarrhea, Difficulty Swallowing, Excessive gas, Gets full quickly at meals, Hemorrhoids, Indigestion, Nausea, Rectal Pain and Vomiting. Male Genitourinary Present- Nocturia. Not Present- Blood in Urine, Change in Urinary Stream, Frequency, Impotence, Painful Urination, Urgency and Urine Leakage. Musculoskeletal Present- Back Pain, Joint Pain and Muscle Weakness. Not Present- Joint Stiffness, Muscle Pain and Swelling of Extremities. Neurological Present- Trouble walking. Not Present- Decreased Memory, Fainting, Headaches, Numbness, Seizures, Tingling, Tremor and Weakness. Psychiatric Not Present- Anxiety, Bipolar, Change in Sleep Pattern, Depression, Fearful and Frequent crying. Endocrine Not Present- Cold Intolerance, Excessive Hunger, Hair Changes, Heat Intolerance, Hot flashes and New Diabetes. Hematology Not Present- Blood Thinners, Easy Bruising, Excessive bleeding, Gland problems, HIV and Persistent Infections.  Vitals (April Staton CMA; 03/08/2016 11:36 AM) 03/08/2016 11:35 AM Weight: 142.5 lb Height: 71in Height was reported by patient. Body Surface Area: 1.83 m Body Mass Index: 19.87 kg/m  Temp.: 97.33F(Oral)  Pulse: 75 (Regular)  P.OX: 96% (Room air) BP: 162/92 (Sitting, Left  Arm, Standard)      Physical Exam Odis Hollingshead MD; 03/08/2016 12:29 PM)  The physical exam findings are as follows: Note:General: Thin male in NAD. Pleasant and cooperative. His wife is with  him.  HEENT: [place]/AT, no external nasal or ear masses, mucous membranes are moist  EYES: EOMI, no scleral icterus, pupils normal. Wears glasses.  NECK: Supple, no obvious mass or thyroid mass/enlargement, no trachea deviation  CV: RRR, no murmur, no edema  CHEST: Breath sounds equal and clear. Respirations nonlabored.  ABDOMEN: Soft, nontender, nondistended, no masses, no organomegaly, no scars, no hernias.  GU: Large right inguinal bulge that is reducible. Giant left inguinal bulge that does not completely see the reducible. It is difficult to palpate his testicles given the size of the hernias.  SKIN: No jaundice or suspicious rashes.  NEUROLOGIC: Alert and oriented, answers questions appropriately, limited gait. Uses a wheelchair mostly.  PSYCHIATRIC: Normal mood, affect , and behavior.    Assessment & Plan Odis Hollingshead MD; 03/08/2016 12:26 PM)  BILATERAL INGUINAL HERNIA WITHOUT OBSTRUCTION OR GANGRENE, RECURRENCE NOT SPECIFIED (K40.20) Impression: The right side is a large hernia. The left side is a giant hernia. I can reduce the right-sided hernia and only partially reducible left-sided hernia. He does need a left hip replacement as well but the hernia is in the way of the incision for the hip replacement.  Plan: I recommended bilateral open inguinal hernia repairs with mesh. I have explained the procedure, risks, and aftercare of inguinal hernia repair. Risks include but are not limited to bleeding, infection, wound problems, anesthesia, recurrence, bladder or intestine injury, urinary retention, testicular dysfunction, chronic pain, mesh problems. He seems to understand and would like to proceed in January, after the holidays are over. We discussed incarceration symptoms  and what to do if that occurred.  Jackolyn Confer, M.D.

## 2016-03-22 ENCOUNTER — Other Ambulatory Visit (HOSPITAL_BASED_OUTPATIENT_CLINIC_OR_DEPARTMENT_OTHER): Payer: Commercial Managed Care - HMO

## 2016-03-22 ENCOUNTER — Ambulatory Visit: Payer: Commercial Managed Care - HMO

## 2016-03-22 DIAGNOSIS — C9310 Chronic myelomonocytic leukemia not having achieved remission: Secondary | ICD-10-CM

## 2016-03-22 DIAGNOSIS — D509 Iron deficiency anemia, unspecified: Secondary | ICD-10-CM

## 2016-03-22 LAB — CBC & DIFF AND RETIC
BASO%: 0.2 % (ref 0.0–2.0)
BASOS ABS: 0 10*3/uL (ref 0.0–0.1)
EOS ABS: 0 10*3/uL (ref 0.0–0.5)
EOS%: 0.6 % (ref 0.0–7.0)
HEMATOCRIT: 34.7 % — AB (ref 38.4–49.9)
HEMOGLOBIN: 10.7 g/dL — AB (ref 13.0–17.1)
Immature Retic Fract: 2.7 % — ABNORMAL LOW (ref 3.00–10.60)
LYMPH#: 1.2 10*3/uL (ref 0.9–3.3)
LYMPH%: 23.8 % (ref 14.0–49.0)
MCH: 26.8 pg — AB (ref 27.2–33.4)
MCHC: 30.8 g/dL — ABNORMAL LOW (ref 32.0–36.0)
MCV: 87 fL (ref 79.3–98.0)
MONO#: 2.3 10*3/uL — AB (ref 0.1–0.9)
MONO%: 46.3 % — ABNORMAL HIGH (ref 0.0–14.0)
NEUT%: 29.1 % — ABNORMAL LOW (ref 39.0–75.0)
NEUTROS ABS: 1.5 10*3/uL (ref 1.5–6.5)
Platelets: 167 10*3/uL (ref 140–400)
RBC: 3.99 10*6/uL — ABNORMAL LOW (ref 4.20–5.82)
RDW: 21.5 % — ABNORMAL HIGH (ref 11.0–14.6)
RETIC %: 1.41 % (ref 0.80–1.80)
RETIC CT ABS: 56.26 10*3/uL (ref 34.80–93.90)
WBC: 5.1 10*3/uL (ref 4.0–10.3)

## 2016-03-22 NOTE — Progress Notes (Signed)
Hemoglobin noted at 10.7 today. No injection needed per protocol order.  Pt given current copy of labs and schedule. Instructed to follow schedule and call office should issues occur. Pt verbalized understanding of instructions

## 2016-04-12 ENCOUNTER — Telehealth: Payer: Self-pay

## 2016-04-19 ENCOUNTER — Ambulatory Visit: Payer: Commercial Managed Care - HMO

## 2016-04-19 ENCOUNTER — Other Ambulatory Visit (HOSPITAL_BASED_OUTPATIENT_CLINIC_OR_DEPARTMENT_OTHER): Payer: Commercial Managed Care - HMO

## 2016-04-19 ENCOUNTER — Telehealth: Payer: Self-pay | Admitting: Hematology and Oncology

## 2016-04-19 ENCOUNTER — Ambulatory Visit (HOSPITAL_BASED_OUTPATIENT_CLINIC_OR_DEPARTMENT_OTHER): Payer: Commercial Managed Care - HMO | Admitting: Hematology and Oncology

## 2016-04-19 ENCOUNTER — Encounter: Payer: Self-pay | Admitting: Hematology and Oncology

## 2016-04-19 DIAGNOSIS — C9310 Chronic myelomonocytic leukemia not having achieved remission: Secondary | ICD-10-CM | POA: Diagnosis not present

## 2016-04-19 DIAGNOSIS — D509 Iron deficiency anemia, unspecified: Secondary | ICD-10-CM

## 2016-04-19 DIAGNOSIS — D630 Anemia in neoplastic disease: Secondary | ICD-10-CM | POA: Diagnosis not present

## 2016-04-19 DIAGNOSIS — I10 Essential (primary) hypertension: Secondary | ICD-10-CM | POA: Diagnosis not present

## 2016-04-19 LAB — CBC & DIFF AND RETIC
BASO%: 0.2 % (ref 0.0–2.0)
BASOS ABS: 0 10*3/uL (ref 0.0–0.1)
EOS ABS: 0 10*3/uL (ref 0.0–0.5)
EOS%: 0.5 % (ref 0.0–7.0)
HEMATOCRIT: 37 % — AB (ref 38.4–49.9)
HEMOGLOBIN: 11.6 g/dL — AB (ref 13.0–17.1)
Immature Retic Fract: 5.7 % (ref 3.00–10.60)
LYMPH#: 1.2 10*3/uL (ref 0.9–3.3)
LYMPH%: 21.5 % (ref 14.0–49.0)
MCH: 28 pg (ref 27.2–33.4)
MCHC: 31.4 g/dL — AB (ref 32.0–36.0)
MCV: 89.2 fL (ref 79.3–98.0)
MONO#: 2.5 10*3/uL — ABNORMAL HIGH (ref 0.1–0.9)
MONO%: 44.4 % — ABNORMAL HIGH (ref 0.0–14.0)
NEUT%: 33.4 % — ABNORMAL LOW (ref 39.0–75.0)
NEUTROS ABS: 1.9 10*3/uL (ref 1.5–6.5)
Platelets: 224 10*3/uL (ref 140–400)
RBC: 4.15 10*6/uL — ABNORMAL LOW (ref 4.20–5.82)
RDW: 20.1 % — AB (ref 11.0–14.6)
RETIC %: 2.18 % — AB (ref 0.80–1.80)
RETIC CT ABS: 90.47 10*3/uL (ref 34.80–93.90)
WBC: 5.5 10*3/uL (ref 4.0–10.3)

## 2016-04-19 LAB — TECHNOLOGIST REVIEW

## 2016-04-19 NOTE — Assessment & Plan Note (Signed)
I reviewed the final cytogenetics results with the patient and his wife. Based on available information, the patient has CMML-1, with calculated intermediate prognostic risk. Estimated median survival without treatment is in the region of 3 years and 25% risk of progression to AML is estimated at 3.2 years. There is no contraindication for him to proceed with elective hip surgery as discussed

## 2016-04-19 NOTE — Assessment & Plan Note (Signed)
The patient is noted to have elevated blood pressure in 2 separate occasions However, he does not check blood pressure at home. He is frail. I recommend he contact his primary care doctor for blood pressure check first before starting him on blood pressure medications

## 2016-04-19 NOTE — Telephone Encounter (Signed)
Appointments scheduled per 1/2 LOS. Patient given AVS report and calendars with future scheduled appointments. °

## 2016-04-19 NOTE — Progress Notes (Signed)
[facility] OFFICE PROGRESS NOTE  Patient Care Team: Aretta Nip, MD as PCP - General (Family Medicine)  SUMMARY OF ONCOLOGIC HISTORY: Oncology History   Calculated IPSS-R: 4 points (cytogenetics 1 point for normal; bone marrow blasts 2 points for 5%; 1 point for hemaoglobin < 10). 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 Calculated IPSS-R: 4 points (cytogenetics 1 point for normal; bone marrow blasts 2 points for 5%; 1 point for hemaoglobin < 10). 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 He returns with his wife. He continues to have chronic joint pain. He has started some natural herbal supplement that regulate his bowel movements. He denies worsening of hernia. Overall, he feels well. Denies recent infection The patient denies any recent signs or symptoms of bleeding such as spontaneous epistaxis, hematuria or hematochezia.   REVIEW OF SYSTEMS:   Constitutional: Denies fevers, chills or abnormal weight loss Eyes: Denies blurriness of vision Ears, nose, mouth, throat, and face: Denies mucositis or sore throat Respiratory: Denies cough, dyspnea or wheezes Cardiovascular: Denies palpitation, chest discomfort or lower extremity swelling Gastrointestinal:  Denies nausea, heartburn or change in bowel habits Skin: Denies abnormal skin rashes Lymphatics: Denies new lymphadenopathy or easy bruising Neurological:Denies  numbness, tingling or new weaknesses Behavioral/Psych: Mood is stable, no new changes  All other systems were reviewed with the patient and are negative.  I have reviewed the past medical history, past surgical history, social history and family history with the patient and they are unchanged from previous note.  ALLERGIES:  has No Known Allergies.  MEDICATIONS:  Current Outpatient Prescriptions  Medication Sig Dispense Refill  . Cholecalciferol (VITAMIN D) 2000 units tablet Take 2,000 Units by mouth daily.    . dorzolamide-timolol (COSOPT) 22.3-6.8 MG/ML ophthalmic solution Place 1 drop into both eyes 2 (two) times daily.    Marland Kitchen latanoprost (XALATAN) 0.005 % ophthalmic solution Place 1 drop into both eyes daily.    . Misc Natural Products (PROSTATE) CAPS Take 1 capsule by mouth daily.    . Multiple Vitamins-Minerals (MULTIVITAMIN & MINERAL PO) Take 1 tablet by mouth daily.    . NON FORMULARY HerniCare 2 tablet 3 times a day. Herb roots    . NON FORMULARY LunaFlex PM 2 tablets at dinner with food- helps sleep     No current facility-administered medications for this visit.     PHYSICAL EXAMINATION: ECOG PERFORMANCE STATUS: 2 - Symptomatic, <50% confined to bed  Vitals:   04/19/16 1025 04/19/16 1026  BP: (!) 172/93 (!) 172/80  Pulse: 70   Resp: 18   Temp: 97.5 F (36.4 C)    Filed Weights   04/19/16 1025  Weight: 156 lb 8 oz (71 kg)    GENERAL:alert, no distress and comfortable SKIN: skin color, texture, turgor are normal, no rashes or significant lesions EYES: normal, Conjunctiva are  pink and non-injected, sclera clear Musculoskeletal:no cyanosis of digits and no clubbing  NEURO: alert & oriented x 3 with fluent speech, no focal motor/sensory deficits  LABORATORY DATA:  I have reviewed the data as listed    Component Value Date/Time   NA 139 12/24/2015 1230   K 3.8 12/24/2015 1230   CO2 23 12/24/2015 1230   GLUCOSE 124 12/24/2015 1230   BUN 21.6 12/24/2015 1230    CREATININE 0.8 12/24/2015 1230   CALCIUM 9.5 12/24/2015 1230   PROT 7.0 12/24/2015 1230   ALBUMIN 3.0 (L) 12/24/2015 1230   AST 36 (H) 12/24/2015 1230   ALT 32 12/24/2015 1230   ALKPHOS 72 12/24/2015 1230   BILITOT 0.63 12/24/2015 1230    No results found for: SPEP, UPEP  Lab Results  Component Value Date   WBC 5.5 04/19/2016   NEUTROABS 1.9 04/19/2016   HGB 11.6 (L) 04/19/2016   HCT 37.0 (L) 04/19/2016   MCV 89.2 04/19/2016   PLT 224 04/19/2016      Chemistry      Component Value Date/Time   NA 139 12/24/2015 1230   K 3.8 12/24/2015 1230   CO2 23 12/24/2015 1230   BUN 21.6 12/24/2015 1230   CREATININE 0.8 12/24/2015 1230      Component Value Date/Time   CALCIUM 9.5 12/24/2015 1230   ALKPHOS 72 12/24/2015 1230   AST 36 (H) 12/24/2015 1230   ALT 32 12/24/2015 1230   BILITOT 0.63 12/24/2015 1230      ASSESSMENT & PLAN:  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 Based on available information, the patient has CMML-1, with calculated intermediate prognostic risk. Estimated median survival without treatment is in the region of 3 years and 25% risk of progression to AML is estimated at 3.2 years. There is no contraindication for him to proceed with elective hip surgery as discussed  Anemia in neoplastic disease He has responded very well to recent IV iron and darbepoetin injection He has not required any injection for 3 months I will see him back in 3 months for further review. If he undergoes hip surgery soon, the patient and family will call me back for an earlier appointment  Essential hypertension The patient is noted to have elevated blood pressure in 2 separate occasions However, he does not check blood pressure at home. He is frail. I recommend he contact his primary care doctor for blood pressure check first before starting him on blood pressure medications   No orders of the  defined types were placed in this encounter.  All questions were answered. The patient knows to call the clinic with any problems, questions or concerns. No barriers to learning was detected. I spent 15 minutes counseling the patient face to face. The total time spent in the appointment was 20 minutes and more than 50% was on counseling and review of test results     Heath Lark, MD 04/19/2016 1:29 PM

## 2016-04-19 NOTE — Assessment & Plan Note (Signed)
He has responded very well to recent IV iron and darbepoetin injection He has not required any injection for 3 months I will see him back in 3 months for further review. If he undergoes hip surgery soon, the patient and family will call me back for an earlier appointment

## 2016-06-07 ENCOUNTER — Ambulatory Visit (HOSPITAL_COMMUNITY)
Admission: RE | Admit: 2016-06-07 | Payer: Commercial Managed Care - HMO | Source: Ambulatory Visit | Admitting: General Surgery

## 2016-06-07 ENCOUNTER — Encounter (HOSPITAL_COMMUNITY): Admission: RE | Payer: Self-pay | Source: Ambulatory Visit

## 2016-06-07 SURGERY — REPAIR, HERNIA, INGUINAL, BILATERAL, ADULT
Anesthesia: Choice | Laterality: Bilateral

## 2016-07-21 ENCOUNTER — Ambulatory Visit (HOSPITAL_BASED_OUTPATIENT_CLINIC_OR_DEPARTMENT_OTHER): Payer: Medicare HMO | Admitting: Hematology and Oncology

## 2016-07-21 ENCOUNTER — Telehealth: Payer: Self-pay | Admitting: Hematology and Oncology

## 2016-07-21 ENCOUNTER — Ambulatory Visit: Payer: Medicare HMO

## 2016-07-21 ENCOUNTER — Other Ambulatory Visit (HOSPITAL_BASED_OUTPATIENT_CLINIC_OR_DEPARTMENT_OTHER): Payer: Medicare HMO

## 2016-07-21 ENCOUNTER — Encounter: Payer: Self-pay | Admitting: Hematology and Oncology

## 2016-07-21 DIAGNOSIS — I10 Essential (primary) hypertension: Secondary | ICD-10-CM

## 2016-07-21 DIAGNOSIS — D630 Anemia in neoplastic disease: Secondary | ICD-10-CM | POA: Diagnosis not present

## 2016-07-21 DIAGNOSIS — C9310 Chronic myelomonocytic leukemia not having achieved remission: Secondary | ICD-10-CM | POA: Diagnosis not present

## 2016-07-21 DIAGNOSIS — D509 Iron deficiency anemia, unspecified: Secondary | ICD-10-CM

## 2016-07-21 LAB — CBC & DIFF AND RETIC
BASO%: 0.2 % (ref 0.0–2.0)
BASOS ABS: 0 10*3/uL (ref 0.0–0.1)
EOS%: 0.7 % (ref 0.0–7.0)
Eosinophils Absolute: 0 10*3/uL (ref 0.0–0.5)
HEMATOCRIT: 36.6 % — AB (ref 38.4–49.9)
HEMOGLOBIN: 11.5 g/dL — AB (ref 13.0–17.1)
IMMATURE RETIC FRACT: 3.8 % (ref 3.00–10.60)
LYMPH#: 1.3 10*3/uL (ref 0.9–3.3)
LYMPH%: 23.1 % (ref 14.0–49.0)
MCH: 30.3 pg (ref 27.2–33.4)
MCHC: 31.4 g/dL — ABNORMAL LOW (ref 32.0–36.0)
MCV: 96.6 fL (ref 79.3–98.0)
MONO#: 2.4 10*3/uL — AB (ref 0.1–0.9)
MONO%: 43 % — ABNORMAL HIGH (ref 0.0–14.0)
NEUT#: 1.8 10*3/uL (ref 1.5–6.5)
NEUT%: 33 % — AB (ref 39.0–75.0)
PLATELETS: 196 10*3/uL (ref 140–400)
RBC: 3.79 10*6/uL — ABNORMAL LOW (ref 4.20–5.82)
RDW: 15.1 % — ABNORMAL HIGH (ref 11.0–14.6)
RETIC CT ABS: 97.4 10*3/uL — AB (ref 34.80–93.90)
Retic %: 2.57 % — ABNORMAL HIGH (ref 0.80–1.80)
WBC: 5.5 10*3/uL (ref 4.0–10.3)

## 2016-07-21 NOTE — Progress Notes (Signed)
[facility] OFFICE PROGRESS NOTE  Patient Care Team: Aretta Nip, MD as PCP - General (Family Medicine)  SUMMARY OF ONCOLOGIC HISTORY: Oncology History   Calculated IPSS-R: 4 points (cytogenetics 1 point for normal; bone marrow blasts 2 points for 5%; 1 point for hemaoglobin < 10). 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 Calculated IPSS-R: 4 points (cytogenetics 1 point for normal; bone marrow blasts 2 points for 5%; 1 point for hemaoglobin < 10). 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 He returns for further follow-up He is doing very well He is getting regular bowel movement with some supplement and the hernia appears to be improving He is hoping he can avoid inguinal hernia surgery and proceed with hip surgery only He feels well No recent infection The patient denies any recent signs or symptoms of bleeding such as spontaneous epistaxis, hematuria or hematochezia.  REVIEW OF SYSTEMS:   Constitutional: Denies fevers, chills or abnormal weight loss Eyes: Denies blurriness of vision Ears, nose, mouth, throat, and face: Denies mucositis or sore throat Respiratory: Denies cough, dyspnea or wheezes Cardiovascular: Denies palpitation, chest discomfort or lower extremity swelling Gastrointestinal:  Denies nausea, heartburn or change in bowel habits Skin: Denies abnormal skin rashes Lymphatics: Denies new lymphadenopathy  or easy bruising Neurological:Denies numbness, tingling or new weaknesses Behavioral/Psych: Mood is stable, no new changes  All other systems were reviewed with the patient and are negative.  I have reviewed the past medical history, past surgical history, social history and family history with the patient and they are unchanged from previous note.  ALLERGIES:  has No Known Allergies.  MEDICATIONS:  Current Outpatient Prescriptions  Medication Sig Dispense Refill  . Cholecalciferol (VITAMIN D) 2000 units tablet Take 2,000 Units by mouth daily.    . dorzolamide-timolol (COSOPT) 22.3-6.8 MG/ML ophthalmic solution Place 1 drop into both eyes 2 (two) times daily.    Marland Kitchen latanoprost (XALATAN) 0.005 % ophthalmic solution Place 1 drop into both eyes daily.    . Misc Natural Products (PROSTATE) CAPS Take 1 capsule by mouth daily.    . Multiple Vitamins-Minerals (MULTIVITAMIN & MINERAL PO) Take 1 tablet by mouth daily.    . NON FORMULARY HerniCare 2 tablet 3 times a day. Herb roots     No current facility-administered medications for this visit.     PHYSICAL EXAMINATION: ECOG PERFORMANCE STATUS: 2 - Symptomatic, <50% confined to bed  Vitals:   07/21/16 1000  BP: (!) 154/91  Pulse: 66  Resp: 17  Temp: 97.6 F (36.4 C)   Filed Weights   07/21/16 1000  Weight: 161 lb 6.4 oz (73.2 kg)    GENERAL:alert, no distress and comfortable SKIN: skin color, texture, turgor are normal, no rashes or significant lesions EYES: normal, Conjunctiva are pink and non-injected, sclera clear NEURO: alert & oriented x 3 with fluent  speech, no focal motor/sensory deficits  LABORATORY DATA:  I have reviewed the data as listed    Component Value Date/Time   NA 139 12/24/2015 1230   K 3.8 12/24/2015 1230   CO2 23 12/24/2015 1230   GLUCOSE 124 12/24/2015 1230   BUN 21.6 12/24/2015 1230   CREATININE 0.8 12/24/2015 1230   CALCIUM 9.5 12/24/2015 1230   PROT 7.0 12/24/2015 1230   ALBUMIN 3.0 (L) 12/24/2015  1230   AST 36 (H) 12/24/2015 1230   ALT 32 12/24/2015 1230   ALKPHOS 72 12/24/2015 1230   BILITOT 0.63 12/24/2015 1230    No results found for: SPEP, UPEP  Lab Results  Component Value Date   WBC 5.5 07/21/2016   NEUTROABS 1.8 07/21/2016   HGB 11.5 (L) 07/21/2016   HCT 36.6 (L) 07/21/2016   MCV 96.6 07/21/2016   PLT 196 07/21/2016      Chemistry      Component Value Date/Time   NA 139 12/24/2015 1230   K 3.8 12/24/2015 1230   CO2 23 12/24/2015 1230   BUN 21.6 12/24/2015 1230   CREATININE 0.8 12/24/2015 1230      Component Value Date/Time   CALCIUM 9.5 12/24/2015 1230   ALKPHOS 72 12/24/2015 1230   AST 36 (H) 12/24/2015 1230   ALT 32 12/24/2015 1230   BILITOT 0.63 12/24/2015 1230      ASSESSMENT & PLAN:  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 Based on available information, the patient has CMML-1, with calculated intermediate prognostic risk. Estimated median survival without treatment is in the region of 3 years and 25% risk of progression to AML is estimated at 3.2 years. There is no contraindication for him to proceed with elective hip surgery as discussed He has stability of his blood count for over 6 months I will see him on a yearly basis I educated the patient and his wife signs and symptoms to watch out for disease progression  Anemia in neoplastic disease He has responded very well to recent IV iron and darbepoetin injection He has not required any injection for 3 months I will see him back in 12 months for further review. If he undergoes hip surgery soon, the patient and family will call me back for an earlier appointment  Essential hypertension The patient is noted to have elevated blood pressure in 2 separate occasions I recommend he contact his primary care doctor for blood pressure check and management   No orders of the defined types were placed in this  encounter.  All questions were answered. The patient knows to call the clinic with any problems, questions or concerns. No barriers to learning was detected. I spent 15 minutes counseling the patient face to face. The total time spent in the appointment was 20 minutes and more than 50% was on counseling and review of test results     Heath Lark, MD 07/21/2016 3:05 PM

## 2016-07-21 NOTE — Assessment & Plan Note (Signed)
I reviewed the final cytogenetics results with the patient and his wife. Based on available information, the patient has CMML-1, with calculated intermediate prognostic risk. Estimated median survival without treatment is in the region of 3 years and 25% risk of progression to AML is estimated at 3.2 years. There is no contraindication for him to proceed with elective hip surgery as discussed He has stability of his blood count for over 6 months I will see him on a yearly basis I educated the patient and his wife signs and symptoms to watch out for disease progression

## 2016-07-21 NOTE — Assessment & Plan Note (Signed)
He has responded very well to recent IV iron and darbepoetin injection He has not required any injection for 3 months I will see him back in 12 months for further review. If he undergoes hip surgery soon, the patient and family will call me back for an earlier appointment

## 2016-07-21 NOTE — Assessment & Plan Note (Signed)
The patient is noted to have elevated blood pressure in 2 separate occasions I recommend he contact his primary care doctor for blood pressure check and management

## 2016-07-21 NOTE — Telephone Encounter (Signed)
Gave patient AVS and calender per 07/21/2016 los. f/u and labs in 1 year

## 2016-12-27 DIAGNOSIS — M25441 Effusion, right hand: Secondary | ICD-10-CM | POA: Diagnosis not present

## 2017-01-30 DIAGNOSIS — M1990 Unspecified osteoarthritis, unspecified site: Secondary | ICD-10-CM | POA: Diagnosis not present

## 2017-01-30 DIAGNOSIS — Z23 Encounter for immunization: Secondary | ICD-10-CM | POA: Diagnosis not present

## 2017-03-14 DIAGNOSIS — H524 Presbyopia: Secondary | ICD-10-CM | POA: Diagnosis not present

## 2017-07-01 DIAGNOSIS — R05 Cough: Secondary | ICD-10-CM | POA: Diagnosis not present

## 2017-07-01 DIAGNOSIS — J00 Acute nasopharyngitis [common cold]: Secondary | ICD-10-CM | POA: Diagnosis not present

## 2017-07-12 DIAGNOSIS — J189 Pneumonia, unspecified organism: Secondary | ICD-10-CM | POA: Diagnosis not present

## 2017-07-12 DIAGNOSIS — R05 Cough: Secondary | ICD-10-CM | POA: Diagnosis not present

## 2017-07-19 ENCOUNTER — Other Ambulatory Visit: Payer: Self-pay

## 2017-07-19 DIAGNOSIS — J189 Pneumonia, unspecified organism: Secondary | ICD-10-CM | POA: Diagnosis not present

## 2017-07-19 DIAGNOSIS — C9310 Chronic myelomonocytic leukemia not having achieved remission: Secondary | ICD-10-CM

## 2017-07-20 ENCOUNTER — Inpatient Hospital Stay: Payer: Medicare HMO | Attending: Hematology and Oncology

## 2017-07-20 ENCOUNTER — Telehealth: Payer: Self-pay | Admitting: Hematology and Oncology

## 2017-07-20 ENCOUNTER — Inpatient Hospital Stay (HOSPITAL_BASED_OUTPATIENT_CLINIC_OR_DEPARTMENT_OTHER): Payer: Medicare HMO | Admitting: Hematology and Oncology

## 2017-07-20 ENCOUNTER — Encounter: Payer: Self-pay | Admitting: Hematology and Oncology

## 2017-07-20 VITALS — BP 108/70 | HR 81 | Temp 97.4°F | Resp 18 | Ht 71.0 in | Wt 157.7 lb

## 2017-07-20 DIAGNOSIS — R5383 Other fatigue: Secondary | ICD-10-CM | POA: Diagnosis not present

## 2017-07-20 DIAGNOSIS — J069 Acute upper respiratory infection, unspecified: Secondary | ICD-10-CM

## 2017-07-20 DIAGNOSIS — D473 Essential (hemorrhagic) thrombocythemia: Secondary | ICD-10-CM | POA: Insufficient documentation

## 2017-07-20 DIAGNOSIS — Z79899 Other long term (current) drug therapy: Secondary | ICD-10-CM

## 2017-07-20 DIAGNOSIS — D539 Nutritional anemia, unspecified: Secondary | ICD-10-CM

## 2017-07-20 DIAGNOSIS — D509 Iron deficiency anemia, unspecified: Secondary | ICD-10-CM

## 2017-07-20 DIAGNOSIS — C9310 Chronic myelomonocytic leukemia not having achieved remission: Secondary | ICD-10-CM | POA: Diagnosis not present

## 2017-07-20 LAB — CBC WITH DIFFERENTIAL (CANCER CENTER ONLY)
BASOS PCT: 1 %
Basophils Absolute: 0.1 10*3/uL (ref 0.0–0.1)
EOS ABS: 0.1 10*3/uL (ref 0.0–0.5)
EOS PCT: 1 %
HCT: 30.1 % — ABNORMAL LOW (ref 38.4–49.9)
Hemoglobin: 9.8 g/dL — ABNORMAL LOW (ref 13.0–17.1)
LYMPHS ABS: 1.4 10*3/uL (ref 0.9–3.3)
Lymphocytes Relative: 15 %
MCH: 28.3 pg (ref 27.2–33.4)
MCHC: 32.7 g/dL (ref 32.0–36.0)
MCV: 86.5 fL (ref 79.3–98.0)
MONO ABS: 2.1 10*3/uL — AB (ref 0.1–0.9)
MONOS PCT: 22 %
Neutro Abs: 5.9 10*3/uL (ref 1.5–6.5)
Neutrophils Relative %: 61 %
PLATELETS: 503 10*3/uL — AB (ref 140–400)
RBC: 3.48 MIL/uL — ABNORMAL LOW (ref 4.20–5.82)
RDW: 18.2 % — AB (ref 11.0–14.6)
WBC Count: 9.6 10*3/uL (ref 4.0–10.3)

## 2017-07-20 NOTE — Progress Notes (Signed)
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[place]) The monocyte count is stable The thrombocytosis and anemia are likely reactive I plan to recheck his blood count again in the month  Iron deficiency anemia He had history of recurrent iron deficiency anemia The mild anemia with thrombocytosis is likely due to this I recommend oral iron supplement with plan to recheck it again next month I have asked the patient and his wife to observe for signs and symptoms of bleeding If he has difficulties absorbing iron, I plan to give him intravenous iron infusion when I see him back next month  Deficiency anemia The patient also had history of anemia chronic illness If, despite iron infusion, he is still anemic, we will resume darbepoetin injection.  He agreed with the plan of care   Orders Placed This Encounter  Procedures  . CBC with Differential/Platelet    Standing Status:   Future    Standing Expiration Date:   08/24/2018  . Ferritin    Standing Status:   Future    Standing Expiration Date:   07/20/2018  . Iron and TIBC    Standing Status:   Future    Standing Expiration Date:   08/24/2018  . Sedimentation rate    Standing Status:   Future    Standing Expiration Date:   08/24/2018  . Erythropoietin    Standing Status:   Future    Standing Expiration Date:   08/24/2018  . Vitamin B12    Standing Status:   Future    Standing Expiration Date:   08/24/2018  . Basic Metabolic Panel - [place] Only    Standing Status:   Future    Standing Expiration Date:   07/21/2018    INTERVAL HISTORY: Please see below for problem oriented charting. He returns with his wife for further follow-up The patient had recurrent upper respiratory tract infection over the last 2 months He is currently still taking doxycycline for recent cough Overall, his cough has  resolved He denies further fever or chills His energy level is poor and he has excessive fatigue The patient denies any recent signs or symptoms of bleeding such as spontaneous epistaxis, hematuria or hematochezia.   SUMMARY OF ONCOLOGIC HISTORY: Oncology History   Calculated IPSS-R: 4 points (cytogenetics 1 point for normal; bone marrow blasts 2 points for 5%; 1 point for hemaoglobin < 10). 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 Calculated IPSS-R: 4 points (cytogenetics 1 point for normal; bone marrow blasts 2 points for 5%; 1 point for hemaoglobin < 10). 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       REVIEW OF SYSTEMS:   Constitutional: Denies fevers, chills or abnormal weight loss Eyes: Denies blurriness of vision Ears, nose, mouth, throat, and face: Denies mucositis or sore throat  Cardiovascular: Denies palpitation, chest discomfort or lower extremity swelling Gastrointestinal:  Denies nausea, heartburn or change in bowel habits Skin: Denies abnormal skin rashes Lymphatics: Denies new lymphadenopathy or easy bruising Neurological:Denies numbness, tingling or new weaknesses Behavioral/Psych: Mood is stable, no new changes  All other systems were reviewed with the patient and are negative.  I have reviewed the past medical history, past surgical history, social history and family history with the patient and they are unchanged from previous note.  ALLERGIES:  has No Known Allergies.  MEDICATIONS:  Current  Outpatient Medications  Medication Sig Dispense Refill  . Cholecalciferol (VITAMIN D) 2000 units tablet Take 2,000 Units by mouth daily.    . dorzolamide-timolol (COSOPT) 22.3-6.8 MG/ML ophthalmic solution Place 1 drop into both eyes 2 (two) times daily.    Marland Kitchen latanoprost (XALATAN) 0.005 % ophthalmic solution Place 1 drop into both eyes daily.    . Misc Natural Products (PROSTATE) CAPS Take 1 capsule by mouth daily.    . Multiple Vitamins-Minerals (MULTIVITAMIN & MINERAL PO) Take 1 tablet by mouth daily.    . NON FORMULARY HerniCare 2 tablet 3 times a day. Herb roots     No current facility-administered medications for this visit.     PHYSICAL EXAMINATION: ECOG PERFORMANCE STATUS: 2 - Symptomatic, <50% confined to bed  Vitals:   07/20/17 1035  BP: 108/70  Pulse: 81  Resp: 18  Temp: (!) 97.4 F (36.3 C)  SpO2: 97%   Filed Weights   07/20/17 1035  Weight: 157 lb 11.2 oz (71.5 kg)    GENERAL:alert, no distress and comfortable SKIN: skin color, texture, turgor are normal, no rashes or significant lesions EYES: normal, Conjunctiva are pink and non-injected, sclera clear OROPHARYNX:no exudate, no erythema and lips, buccal mucosa, and tongue normal  NECK: supple, thyroid normal size, non-tender, without nodularity LYMPH:  no palpable lymphadenopathy in the cervical, axillary or inguinal LUNGS: clear to auscultation and percussion with normal breathing effort HEART: regular rate & rhythm and no murmurs and no lower extremity edema ABDOMEN:abdomen soft, non-tender and normal bowel sounds Musculoskeletal:no cyanosis of digits and no clubbing  NEURO: alert & oriented x 3 with fluent speech, no focal motor/sensory deficits  LABORATORY DATA:  I have reviewed the data as listed    Component Value Date/Time   NA 139 12/24/2015 1230   K 3.8 12/24/2015 1230   CO2 23 12/24/2015 1230   GLUCOSE 124 12/24/2015 1230   BUN 21.6 12/24/2015 1230   CREATININE 0.8 12/24/2015 1230   CALCIUM 9.5  12/24/2015 1230   PROT 7.0 12/24/2015 1230   ALBUMIN 3.0 (L) 12/24/2015 1230   AST 36 (H) 12/24/2015 1230   ALT 32 12/24/2015 1230   ALKPHOS 72 12/24/2015 1230   BILITOT 0.63 12/24/2015 1230    No results found for: SPEP, UPEP  Lab Results  Component Value Date   WBC 9.6 07/20/2017   NEUTROABS 5.9 07/20/2017   HGB 11.5 (L) 07/21/2016   HCT 30.1 (L) 07/20/2017   MCV 86.5 07/20/2017   PLT 503 (H) 07/20/2017      Chemistry      Component Value Date/Time   NA 139 12/24/2015 1230   K 3.8 12/24/2015 1230   CO2 23 12/24/2015 1230   BUN 21.6 12/24/2015 1230   CREATININE 0.8 12/24/2015 1230      Component Value Date/Time   CALCIUM 9.5 12/24/2015 1230   ALKPHOS 72 12/24/2015 1230   AST 36 (H) 12/24/2015 1230   ALT 32 12/24/2015  1230   BILITOT 0.63 12/24/2015 1230       All questions were answered. The patient knows to call the clinic with any problems, questions or concerns. No barriers to learning was detected.  I spent 15 minutes counseling the patient face to face. The total time spent in the appointment was 20 minutes and more than 50% was on counseling and review of test results  Heath Lark, MD 07/20/2017 11:09 AM

## 2017-07-20 NOTE — Assessment & Plan Note (Signed)
He had history of recurrent iron deficiency anemia The mild anemia with thrombocytosis is likely due to this I recommend oral iron supplement with plan to recheck it again next month I have asked the patient and his wife to observe for signs and symptoms of bleeding If he has difficulties absorbing iron, I plan to give him intravenous iron infusion when I see him back next month

## 2017-07-20 NOTE — Telephone Encounter (Signed)
Gave avs and calendar ° °

## 2017-07-20 NOTE — Assessment & Plan Note (Signed)
The monocyte count is stable The thrombocytosis and anemia are likely reactive I plan to recheck his blood count again in the month

## 2017-07-20 NOTE — Assessment & Plan Note (Signed)
The patient also had history of anemia chronic illness If, despite iron infusion, he is still anemic, we will resume darbepoetin injection.  He agreed with the plan of care

## 2017-08-03 ENCOUNTER — Emergency Department (HOSPITAL_BASED_OUTPATIENT_CLINIC_OR_DEPARTMENT_OTHER)
Admission: EM | Admit: 2017-08-03 | Discharge: 2017-08-04 | Disposition: A | Discharge status: Home / Self Care | Payer: Medicare HMO | Attending: Emergency Medicine | Admitting: Emergency Medicine

## 2017-08-03 ENCOUNTER — Other Ambulatory Visit: Payer: Self-pay | Admitting: Family Medicine

## 2017-08-03 ENCOUNTER — Emergency Department (HOSPITAL_BASED_OUTPATIENT_CLINIC_OR_DEPARTMENT_OTHER): Payer: Medicare HMO

## 2017-08-03 ENCOUNTER — Encounter (HOSPITAL_BASED_OUTPATIENT_CLINIC_OR_DEPARTMENT_OTHER): Payer: Self-pay | Admitting: *Deleted

## 2017-08-03 ENCOUNTER — Ambulatory Visit
Admission: RE | Admit: 2017-08-03 | Discharge: 2017-08-03 | Disposition: A | Discharge status: Home / Self Care | Payer: Medicare HMO | Source: Ambulatory Visit | Attending: Family Medicine | Admitting: Family Medicine

## 2017-08-03 ENCOUNTER — Other Ambulatory Visit: Payer: Self-pay

## 2017-08-03 DIAGNOSIS — S62232A Other displaced fracture of base of first metacarpal bone, left hand, initial encounter for closed fracture: Secondary | ICD-10-CM | POA: Diagnosis not present

## 2017-08-03 DIAGNOSIS — J189 Pneumonia, unspecified organism: Secondary | ICD-10-CM

## 2017-08-03 DIAGNOSIS — S0993XA Unspecified injury of face, initial encounter: Secondary | ICD-10-CM | POA: Diagnosis not present

## 2017-08-03 DIAGNOSIS — Y999 Unspecified external cause status: Secondary | ICD-10-CM | POA: Diagnosis not present

## 2017-08-03 DIAGNOSIS — W19XXXA Unspecified fall, initial encounter: Secondary | ICD-10-CM

## 2017-08-03 DIAGNOSIS — Y939 Activity, unspecified: Secondary | ICD-10-CM | POA: Insufficient documentation

## 2017-08-03 DIAGNOSIS — M79642 Pain in left hand: Secondary | ICD-10-CM | POA: Diagnosis not present

## 2017-08-03 DIAGNOSIS — S62235A Other nondisplaced fracture of base of first metacarpal bone, left hand, initial encounter for closed fracture: Secondary | ICD-10-CM

## 2017-08-03 DIAGNOSIS — W050XXA Fall from non-moving wheelchair, initial encounter: Secondary | ICD-10-CM | POA: Diagnosis not present

## 2017-08-03 DIAGNOSIS — Z85828 Personal history of other malignant neoplasm of skin: Secondary | ICD-10-CM | POA: Insufficient documentation

## 2017-08-03 DIAGNOSIS — S0990XA Unspecified injury of head, initial encounter: Secondary | ICD-10-CM | POA: Diagnosis not present

## 2017-08-03 DIAGNOSIS — S61412A Laceration without foreign body of left hand, initial encounter: Secondary | ICD-10-CM | POA: Diagnosis not present

## 2017-08-03 DIAGNOSIS — J9811 Atelectasis: Secondary | ICD-10-CM | POA: Diagnosis not present

## 2017-08-03 DIAGNOSIS — S62255A Nondisplaced fracture of neck of first metacarpal bone, left hand, initial encounter for closed fracture: Secondary | ICD-10-CM | POA: Insufficient documentation

## 2017-08-03 DIAGNOSIS — Y929 Unspecified place or not applicable: Secondary | ICD-10-CM | POA: Diagnosis not present

## 2017-08-03 DIAGNOSIS — I10 Essential (primary) hypertension: Secondary | ICD-10-CM | POA: Diagnosis not present

## 2017-08-03 DIAGNOSIS — Z79899 Other long term (current) drug therapy: Secondary | ICD-10-CM | POA: Diagnosis not present

## 2017-08-03 DIAGNOSIS — S0121XA Laceration without foreign body of nose, initial encounter: Secondary | ICD-10-CM | POA: Insufficient documentation

## 2017-08-03 DIAGNOSIS — Z23 Encounter for immunization: Secondary | ICD-10-CM | POA: Insufficient documentation

## 2017-08-03 DIAGNOSIS — S022XXA Fracture of nasal bones, initial encounter for closed fracture: Secondary | ICD-10-CM | POA: Diagnosis not present

## 2017-08-03 MED ORDER — TETANUS-DIPHTH-ACELL PERTUSSIS 5-2.5-18.5 LF-MCG/0.5 IM SUSP
0.5000 mL | Freq: Once | INTRAMUSCULAR | Status: AC
  Administered 2017-08-03: 0.5 mL via INTRAMUSCULAR
  Filled 2017-08-03: qty 0.5

## 2017-08-03 MED ORDER — LIDOCAINE HCL 2 % IJ SOLN
10.0000 mL | Freq: Once | INTRAMUSCULAR | Status: AC
  Administered 2017-08-03: 200 mg via INTRADERMAL
  Filled 2017-08-03: qty 20

## 2017-08-03 NOTE — ED Provider Notes (Signed)
[facility] EMERGENCY DEPARTMENT Provider Note   CSN: [id number] Arrival date & time: 08/03/17  1928     History   Chief Complaint Chief Complaint  Patient presents with  . Fall    HPI Stephen Jones is a 84 y.o. male with history of CML, hypertension, glaucoma, osteoarthritis presents today for evaluation of acute onset of multiple lacerations status post mechanical fall just prior to arrival.  Patient states that he was attempting to transfer himself off of his wheelchair onto the couch.  He typically does this by steadying himself on a table.  He states his hand slipped and he fell forward striking his nose on the base of the couch.  He denies any prodrome to the fall including lightheadedness, shortness of breath, or headache.  He denies syncope.  He sustained a laceration to the dorsum of his nose, superior to the lip, and to the dorsum of the left hand.  He notes some swelling.  He denies any pain at this time.  He denies headaches, numbness, tingling, weakness, vision changes, nausea, vomiting, chest pain, or shortness of breath.  No medications prior to arrival.  He is not on blood thinners.  He is not sure if his tetanus is up-to-date.  Of note, patient was recently treated for pneumonia with doxycycline.  He underwent repeat chest x-ray today ordered by his primary care physician for reevaluation which showed a right lower lobe consolidation and patchy left base opacities.  A prescription for a new antibiotic was sent to his pharmacy but has not been filled yet.  The history is provided by the patient, the spouse and a relative.    Past Medical History:  Diagnosis Date  . Anemia   . Glaucoma   . Osteoarthritis   . Skin cancer    basal cell carcinoma    Patient Active Problem List   Diagnosis Date Noted  . Essential hypertension 04/19/2016  . Anemia in neoplastic disease 01/12/2016  . CMML (chronic myelomonocytic leukemia) ([place]) 12/31/2015  . Deficiency anemia  12/31/2015  . Preventive measure 12/31/2015  . Iron deficiency anemia 12/31/2015  . Recurrent fever 12/24/2015  . Abnormal weight loss 11/01/2015  . Chronic pain of multiple joints 11/01/2015  . Anemia in chronic illness 11/01/2015  . Monocytosis 10/30/2015    Past Surgical History:  Procedure Laterality Date  . cataracts    . SKIN SURGERY          Home Medications    Prior to Admission medications   Medication Sig Start Date End Date Taking? Authorizing Provider  Cholecalciferol (VITAMIN D) 2000 units tablet Take 2,000 Units by mouth daily.    [provider]  dorzolamide-timolol (COSOPT) 22.3-6.8 MG/ML ophthalmic solution Place 1 drop into both eyes 2 (two) times daily. 10/14/15   [provider]  latanoprost (XALATAN) 0.005 % ophthalmic solution Place 1 drop into both eyes daily.    [provider]  Misc Natural Products (PROSTATE) CAPS Take 1 capsule by mouth daily.    [provider]  Multiple Vitamins-Minerals (MULTIVITAMIN & MINERAL PO) Take 1 tablet by mouth daily.    [provider]  NON FORMULARY HerniCare 2 tablet 3 times a day. Herb roots    [provider]    Family History Family History  Problem Relation Age of Onset  . Cancer Mother        colon cancer  . Cancer Son        hodgkin    Social  History Social History   Tobacco Use  . Smoking status: Never Smoker  . Smokeless tobacco: Never Used  Substance Use Topics  . Alcohol use: No  . Drug use: No     Allergies   Patient has no known allergies.   Review of Systems Review of Systems  Constitutional: Negative for chills and fever.  Eyes: Negative for photophobia and visual disturbance.  Musculoskeletal: Negative for back pain, myalgias and neck pain.  Skin: Positive for wound.  Neurological: Negative for syncope, weakness, light-headedness, numbness and headaches.  All other systems reviewed and are negative.    Physical Exam Updated  Vital Signs BP (!) 152/87   Pulse 77   Temp 98.4 F (36.9 C) (Oral)   Resp 20   Ht 5\' 11"  (1.803 m)   Wt 71.2 kg (157 lb)   SpO2 96%   BMI 21.90 kg/m   Physical Exam  Constitutional: He is oriented to person, place, and time. He appears well-developed and well-nourished. No distress.  HENT:  Head: Normocephalic.  No Battle's signs, no raccoon's eyes, no rhinorrhea. No hemotympanum. No tenderness to palpation of the skull.  There is a 2 cm laceration to the dorsum of the nose proximally.  Oozing small amount of blood. There is a superficial abrasion to the philtrum, bleeding controlled.    Eyes: Pupils are equal, round, and reactive to light. Conjunctivae and EOM are normal. Right eye exhibits no discharge. Left eye exhibits no discharge.  No pain with EOMs  Neck: Normal range of motion. Neck supple. No JVD present. No tracheal deviation present.  No midline spine TTP, no paraspinal muscle tenderness, no deformity, crepitus, or step-off noted   Cardiovascular: Normal rate and intact distal pulses.  2+ radial and DP/PT pulses bl  Pulmonary/Chest: Effort normal. He exhibits no tenderness.  Abdominal: Soft. He exhibits no distension. There is no tenderness.  Musculoskeletal: He exhibits tenderness. He exhibits no edema.  Limited range of motion of the bilateral shoulders.  Patient states this is chronic and unchanged due to an injury in the past. No midline spine TTP, no paraspinal muscle tenderness, no deformity, crepitus, or step-off noted.  5/5 strength of BUE and BLE major muscle groups.  2 cm superficial laceration to the dorsum of the left hand between the first and second metacarpals. No snuffbox tenderness.  There is some tenderness to palpation surrounding the laceration and underlying swelling and ecchymosis.  No crepitus noted.  There is mild pain to palpation of the base of the left thumb metacarpal.  Good grip strength bilaterally.  5/5 strength of wrist and digits with  flexion and extension against resistance.  Neurological: He is alert and oriented to person, place, and time. No cranial nerve deficit or sensory deficit. He exhibits normal muscle tone.  Fluent speech with no evidence of dysarthria or aphasia, no facial droop, cranial nerves II through XII tested and intact.  Sensation intact to soft touch of extremities.  Wheelchair  bound at baseline.  Skin: Skin is warm and dry. No erythema.  Psychiatric: He has a normal mood and affect. His behavior is normal.  Nursing note and vitals reviewed.    ED Treatments / Results  Labs (all labs ordered are listed, but only abnormal results are displayed) Labs Reviewed - No data to display  EKG None  Radiology Dg Nasal Bones  Result Date: 08/03/2017 CLINICAL DATA:  Laceration across the nose after fall. EXAM: NASAL BONES - 3+ VIEW COMPARISON:  None. FINDINGS: Left  nasal tip fracture is noted with slight dorsal angulation and with overlying soft tissue swelling IMPRESSION: Left nasal tip fracture. Electronically Signed   By: Ashley Royalty M.D.   On: 08/03/2017 20:57   Dg Chest 2 View  Result Date: 08/03/2017 CLINICAL DATA:  Follow-up pneumonia EXAM: CHEST - 2 VIEW COMPARISON:  None. FINDINGS: Consolidation in the right lower lobe compatible with pneumonia. Mild hyperinflation. Mild cardiomegaly. Patchy opacities in the left lung base could also reflect pneumonia. No effusions or acute bony abnormality. IMPRESSION: Dense consolidation in the right lower lobe compatible with pneumonia. Patchy opacities in the left base could also reflect pneumonia. Cardiomegaly.  Hyperinflation. Electronically Signed   By: Rolm Baptise M.D.   On: 08/03/2017 10:08   Ct Head Wo Contrast  Result Date: 08/03/2017 CLINICAL DATA:  Fall EXAM: CT HEAD WITHOUT CONTRAST CT MAXILLOFACIAL WITHOUT CONTRAST TECHNIQUE: Multidetector CT imaging of the head and maxillofacial structures were performed using the standard protocol without intravenous  contrast. Multiplanar CT image reconstructions of the maxillofacial structures were also generated. COMPARISON:  None. FINDINGS: CT HEAD FINDINGS Brain: No intracranial hemorrhage. No parenchymal contusion. No midline shift or mass effect. Basilar cisterns are patent. No skull base fracture. No fluid in the paranasal sinuses or mastoid air cells. Orbits are normal. There are periventricular and subcortical white matter hypodensities. Generalized cortical atrophy. Vascular: No hyperdense vessel or unexpected calcification. Skull: Normal. Negative for fracture or focal lesion. Sinuses/Orbits: Paranasal sinuses and mastoid air cells are clear. Orbits are clear. Other: None. CT MAXILLOFACIAL FINDINGS Osseous: Orbital walls are intact. Zygomatic arches are intact. No maxillary bone fracture. No nasal bone fracture evident. Pterygoid plates are normal. Mandibular condyles are located. No mandibular fracture. Orbits: Negative. No traumatic or inflammatory finding. Sinuses: No fluid in the paranasal sinuses Soft tissues: Soft tissues laceration over the bridge of the nose IMPRESSION: 1. No intracranial trauma. 2. Atrophy and microvascular disease. 3. No facial bone fracture. Electronically Signed   By: Suzy Bouchard M.D.   On: 08/03/2017 23:29   Dg Hand Complete Left  Result Date: 08/03/2017 CLINICAL DATA:  Left hand pain after fall today. EXAM: LEFT HAND - COMPLETE 3+ VIEW COMPARISON:  None. FINDINGS: Normal bone mineralization. Suspect fracture involving the base of the thumb metacarpal given what appears to be slight displacement involving the volar ulnar cortex of the metacarpal. Osteoarthritis of the DIP and PIP joints of the second through fifth digits, interphalangeal joint of the thumb as well as at the base of the thumb metacarpal. Chondrocalcinosis a triangular fibrocartilage is noted as well as radiocarpal joint cartilage. This can be seen in calcium pyrophosphate deposition disease (pseudogout). Ankylosed  appearance of the fifth DIP joint. Erosive osteoarthritic appearance of the second and third DIP joints. IMPRESSION: Cortical irregularity at the base of the thumb metacarpal consistent with a minimally displaced fracture. No definite intra-articular involvement. Osteoarthritis of the peripheral joints of the left hand as well as base of thumb metacarpal and first MCP. Electronically Signed   By: Ashley Royalty M.D.   On: 08/03/2017 20:55   Ct Maxillofacial Wo Cm  Result Date: 08/03/2017 CLINICAL DATA:  Fall EXAM: CT HEAD WITHOUT CONTRAST CT MAXILLOFACIAL WITHOUT CONTRAST TECHNIQUE: Multidetector CT imaging of the head and maxillofacial structures were performed using the standard protocol without intravenous contrast. Multiplanar CT image reconstructions of the maxillofacial structures were also generated. COMPARISON:  None. FINDINGS: CT HEAD FINDINGS Brain: No intracranial hemorrhage. No parenchymal contusion. No midline shift or mass effect. Basilar cisterns  are patent. No skull base fracture. No fluid in the paranasal sinuses or mastoid air cells. Orbits are normal. There are periventricular and subcortical white matter hypodensities. Generalized cortical atrophy. Vascular: No hyperdense vessel or unexpected calcification. Skull: Normal. Negative for fracture or focal lesion. Sinuses/Orbits: Paranasal sinuses and mastoid air cells are clear. Orbits are clear. Other: None. CT MAXILLOFACIAL FINDINGS Osseous: Orbital walls are intact. Zygomatic arches are intact. No maxillary bone fracture. No nasal bone fracture evident. Pterygoid plates are normal. Mandibular condyles are located. No mandibular fracture. Orbits: Negative. No traumatic or inflammatory finding. Sinuses: No fluid in the paranasal sinuses Soft tissues: Soft tissues laceration over the bridge of the nose IMPRESSION: 1. No intracranial trauma. 2. Atrophy and microvascular disease. 3. No facial bone fracture. Electronically Signed   By: Suzy Bouchard M.D.   On: 08/03/2017 23:29    Procedures .Marland KitchenLaceration Repair Date/Time: 08/04/2017 12:06 AM Performed by: Renita Papa, PA-C Authorized by: Renita Papa, PA-C   Consent:    Consent obtained:  Verbal   Consent given by:  Patient   Risks discussed:  Infection, pain and poor cosmetic result Anesthesia (see MAR for exact dosages):    Anesthesia method:  Local infiltration   Local anesthetic:  Lidocaine 2% w/o epi Laceration details:    Location:  Face   Face location:  Nose   Length (cm):  2   Depth (mm):  2 Repair type:    Repair type:  Simple Pre-procedure details:    Preparation:  Patient was prepped and draped in usual sterile fashion and imaging obtained to evaluate for foreign bodies Exploration:    Hemostasis achieved with:  Direct pressure   Wound exploration: wound explored through full range of motion and entire depth of wound probed and visualized     Wound extent: no muscle damage noted, no tendon damage noted and no underlying fracture noted     Contaminated: no   Treatment:    Area cleansed with:  Betadine and saline   Amount of cleaning:  Extensive   Irrigation solution:  Sterile saline   Irrigation method:  Syringe   Visualized foreign bodies/material removed: no   Skin repair:    Repair method:  Sutures   Suture size:  5-0   Suture material:  Prolene   Suture technique:  Simple interrupted   Number of sutures:  5 Approximation:    Approximation:  Close Post-procedure details:    Dressing:  Antibiotic ointment and sterile dressing   Patient tolerance of procedure:  Tolerated well, no immediate complications .Marland KitchenLaceration Repair Date/Time: 08/04/2017 12:07 AM Performed by: Renita Papa, PA-C Authorized by: Renita Papa, PA-C   Consent:    Consent obtained:  Verbal   Consent given by:  Patient   Risks discussed:  Infection, pain and poor cosmetic result Anesthesia (see MAR for exact dosages):    Anesthesia method:  None Laceration details:     Location:  Hand   Hand location:  L hand, dorsum   Length (cm):  2   Depth (mm):  1 Repair type:    Repair type:  Simple Pre-procedure details:    Preparation:  Patient was prepped and draped in usual sterile fashion and imaging obtained to evaluate for foreign bodies Exploration:    Hemostasis achieved with:  Direct pressure   Wound exploration: wound explored through full range of motion and entire depth of wound probed and visualized     Wound extent: underlying fracture  Treatment:    Area cleansed with:  Saline   Amount of cleaning:  Extensive   Irrigation solution:  Sterile saline   Irrigation method:  Pressure wash   Visualized foreign bodies/material removed: no   Skin repair:    Repair method:  Steri-Strips   Number of Steri-Strips:  4 Approximation:    Approximation:  Close Post-procedure details:    Dressing:  Splint for protection   Patient tolerance of procedure:  Tolerated well, no immediate complications   (including critical care time)  Medications Ordered in ED Medications  Tdap (BOOSTRIX) injection 0.5 mL (0.5 mLs Intramuscular Given 08/03/17 2309)  lidocaine (XYLOCAINE) 2 % (with pres) injection 200 mg (200 mg Intradermal Given 08/03/17 2309)     Initial Impression / Assessment and Plan / ED Course  I have reviewed the triage vital signs and the nursing notes.  Pertinent labs & imaging results that were available during my care of the patient were reviewed by me and considered in my medical decision making (see chart for details).     Patient presents for evaluation status post mechanical fall just prior to arrival.  No loss of consciousness.  He has multiple lacerations and contusions.  X-rays of the left hand show a possible cortical irregularity suggestive of a minimally displaced left thumb metacarpal base fracture.  He is tender to palpation overlying this area.  There is a laceration overlying this area suggesting open fracture.  Does not  articulate with the joint space.  No focal neurologic deficits and he is at his neurologic baseline.  He is wheelchair-bound at baseline.  Nasal bone x-ray shows a left nasal tip fracture however CT of the head and maxillofacial CT showed no acute intracranial abnormality, skull fracture, or facial bone fractures.  Tetanus was updated in the ED today. Pressure irrigation performed. Wounds explored and base of wound visualized in a bloodless field without evidence of foreign body.  Laceration occurred < 8 hours prior to repair which was well tolerated.  Laceration to the dorsum of the left hand was amenable to repair with Steri-Strips.  He does have a superficial skin abrasion superior to the upper lip which does not require repair with sutures or Steri-Strips but was dressed with antibiotic ointment and a bandage.  His primary care physician is initiating antibiotic therapy for a concurrent pneumonia which will also cover for skin flora.  He was placed in a thumb spica splint.  Discussed suture home care with patient and family and answered questions. Pt to follow-up for wound check and suture removal in 5 days; they are to return to the ED sooner for signs of infection.  He does have a follow-up appointment with his primary care physician in 5 days so I encouraged him to keep his appointment as they can remove his sutures as well.  He will also follow-up with Dr. Lenon Curt with hand surgery for reevaluation of his metacarpal base fracture.  Discussed strict ED return precautions.  Patient and patient's family verbalized understanding of and agreement with plan and patient is stable for discharge home at this time.  Final Clinical Impressions(s) / ED Diagnoses   Final diagnoses:  Fall, initial encounter  Laceration of nose, initial encounter  Closed nondisplaced fracture of base of first metacarpal bone of left hand, unspecified fracture morphology, initial encounter    ED Discharge Orders    None         Debroah Baller 08/04/17 0023    Tanna Furry,  MD 08/04/17 2031

## 2017-08-03 NOTE — ED Triage Notes (Signed)
Fall. He lost his balance and fell hitting his face on the wicker arm of a sofa. Laceration to his nose, above his upper lip. Swelling, bruising, laceration and pain to his left hand.

## 2017-08-04 NOTE — Discharge Instructions (Signed)
The antibiotic that you will start for your pneumonia will also cover for possible skin infection.  Take this antibiotic to completion.  Alternate 600 mg of ibuprofen and [phone] mg of Tylenol every 3 hours as needed for pain. Do not exceed 4000 mg of Tylenol daily.  Take ibuprofen with food to avoid upset stomach issues.   Wear the wrist splint at all times except to shower.  Apply ice pack for comfort.  Do not take a shower for the next 24 hours. Keep wound clean with warm soap and water and keep bandage dry.    Please return in 5 days to have your stitches/staples removed or sooner if you have concerns. Can also have stitches removed at your primary care office or at an urgent care.  Follow-up with Dr. Lenon Curt with hand surgery for reevaluation of hand fracture.  Return to the emergency department sooner for increased redness, drainage of pus from the wound, fevers, severe headaches, or altered mental status.   WOUND CARE  Keep area clean and dry for 24 hours. Do not remove bandage, if applied.  After 24 hours, remove bandage and wash wound gently with mild soap and warm water. Reapply a new bandage after cleaning wound, if directed.   Continue daily cleansing with soap and water until stitches/staples are removed.  Do not apply any ointments or creams to the wound while stitches/staples are in place, as this may cause delayed healing. Return if you experience any of the following signs of infection: Swelling, redness, pus drainage, streaking, fever >101.0 F  Return if you experience excessive bleeding that does not stop after 15-20 minutes of constant, firm pressure.

## 2017-08-09 DIAGNOSIS — C9310 Chronic myelomonocytic leukemia not having achieved remission: Secondary | ICD-10-CM | POA: Diagnosis not present

## 2017-08-09 DIAGNOSIS — S0181XD Laceration without foreign body of other part of head, subsequent encounter: Secondary | ICD-10-CM | POA: Diagnosis not present

## 2017-08-09 DIAGNOSIS — R5381 Other malaise: Secondary | ICD-10-CM | POA: Diagnosis not present

## 2017-08-09 DIAGNOSIS — J189 Pneumonia, unspecified organism: Secondary | ICD-10-CM | POA: Diagnosis not present

## 2017-08-11 DIAGNOSIS — Z4802 Encounter for removal of sutures: Secondary | ICD-10-CM | POA: Diagnosis not present

## 2017-08-16 ENCOUNTER — Ambulatory Visit (INDEPENDENT_AMBULATORY_CARE_PROVIDER_SITE_OTHER): Payer: Medicare HMO | Admitting: Internal Medicine

## 2017-08-16 ENCOUNTER — Encounter: Payer: Self-pay | Admitting: Internal Medicine

## 2017-08-16 VITALS — BP 112/68 | HR 78 | Ht 71.0 in | Wt 154.8 lb

## 2017-08-16 DIAGNOSIS — J189 Pneumonia, unspecified organism: Secondary | ICD-10-CM

## 2017-08-16 DIAGNOSIS — J181 Lobar pneumonia, unspecified organism: Secondary | ICD-10-CM

## 2017-08-16 NOTE — Progress Notes (Signed)
Subjective:    Patient ID: Stephen Jones, male    DOB: 07/20/1928, 84 y.o.   MRN: [id number]  PCP Rankins, Bill Salinas, MD   HPI   IOV 08/16/2017  Chief Complaint  Patient presents with  . Consult    Referred by Dr. Radene Ou due to rt lower lobe pna.  Pt was diagnosed with pna about a month ago. Denies any complaints of cough, SOB, or CP.    84 year old male accompanied by his wife.  He has CML with anemia.  In addition he has frail muscular strength and uses a wheelchair.  He does transfer to the dining table from his wheelchair.  According to him and his wife in February 2019 they both simultaneously and in parallel had respiratory symptoms suggestive of a flulike illness.  They both recovered from it.  However this thing relapsed again in March 2019.  The wife recovered spontaneously without any antibiotics but the patient/husband did not.  Then sometime between the first week of April 2019 so primary care physician and had a chest x-ray.  They got a phone call saying he had pneumonia and was given doxycycline.  With the doxycycline course his symptoms improved and he resolved.  10 days later August 03, 2017 they had a repeat chest x-ray.  This is the only chest x-ray that is available for my personal visualization.  I personally visualize this and agree with the findings of right lower lobe consolidation.  But at this time he was asymptomatic.  He was then given a course of Levaquin greater than 5 or 7 days.  He took this and did not feel any different.  This is because symptoms had already resolved.  Therefore this referral was made and he is here to see me today Aug 16, 2017 and he feels at baseline health.  There are no hemoptysis or chest pain or pedal edema or wheezing or cough or respiratory distress or shortness of breath or weight loss   IMPRESSION: Dense consolidation in the right lower lobe compatible with pneumonia. Patchy opacities in the left base could also  reflect pneumonia.  Cardiomegaly.  Hyperinflation.   Electronically Signed   By: Rolm Baptise M.D.   On: 08/03/2017 10:08    has a past medical history of Anemia, Glaucoma, Osteoarthritis, and Skin cancer.   reports that he has never smoked. He has never used smokeless tobacco.  Past Surgical History:  Procedure Laterality Date  . cataracts    . SKIN SURGERY      Allergies  Allergen Reactions  . Prednisone     Upsets stomach  . Tramadol     Weakness, spaced out feeling    Immunization History  Administered Date(s) Administered  . Influenza, High Dose Seasonal PF 12/31/2016  . Influenza,inj,Quad PF,6+ Mos 12/31/2015  . Tdap 08/03/2017    Family History  Problem Relation Age of Onset  . Cancer Mother        colon cancer  . Cancer Son        hodgkin     Current Outpatient Medications:  .  Cholecalciferol (VITAMIN D) 2000 units tablet, Take 2,000 Units by mouth daily., Disp: , Rfl:  .  dorzolamide-timolol (COSOPT) 22.3-6.8 MG/ML ophthalmic solution, Place 1 drop into both eyes 2 (two) times daily., Disp: , Rfl:  .  latanoprost (XALATAN) 0.005 % ophthalmic solution, Place 1 drop into both eyes daily., Disp: , Rfl:  .  Misc Natural Products (PROSTATE) CAPS, Take 1 capsule by  mouth daily., Disp: , Rfl:  .  Multiple Vitamins-Minerals (MULTIVITAMIN & MINERAL PO), Take 1 tablet by mouth daily., Disp: , Rfl:  .  NON FORMULARY, [employer] 2 tablet 3 times a day. Herb roots, Disp: , Rfl:  .  traZODone (DESYREL) 50 MG tablet, , Disp: , Rfl: 0    Review of Systems  Constitutional: Positive for unexpected weight change. Negative for fever.  HENT: Negative for congestion, dental problem, ear pain, nosebleeds, postnasal drip, rhinorrhea, sinus pressure, sneezing, sore throat and trouble swallowing.   Eyes: Negative for redness and itching.  Respiratory: Negative for cough, chest tightness, shortness of breath and wheezing.   Cardiovascular: Negative for palpitations and  leg swelling.  Gastrointestinal: Negative for nausea and vomiting.  Genitourinary: Negative for dysuria.  Musculoskeletal: Negative for joint swelling.  Skin: Negative for rash.  Allergic/Immunologic: Negative.  Negative for environmental allergies, food allergies and immunocompromised state.  Neurological: Negative for headaches.  Hematological: Does not bruise/bleed easily.  Psychiatric/Behavioral: Negative for dysphoric mood. The patient is not nervous/anxious.        Objective:   Physical Exam Vitals:   08/16/17 1052  BP: 112/68  Pulse: 78  SpO2: 99%  Weight: 154 lb 12.8 oz (70.2 kg)  Height: 5\' 11"  (1.803 m)    Estimated body mass index is 21.59 kg/m as calculated from the following:   Height as of this encounter: 5\' 11"  (1.803 m).   Weight as of this encounter: 154 lb 12.8 oz (70.2 kg).   General Appearance:   thin male sitting on wheel chair. Looking younger than stated age  Head:    Normocephalic, without obvious abnormality, atraumatic  Eyes:    PERRL - yes, conjunctiva/corneas - clear      Ears:    Normal external ear canals, both ears  Nose:   NG tube - no  Throat:  ETT TUBE - no , OG tube - no  Neck:   Supple,  No enlargement/tenderness/nodules     Lungs:     Clear to auscultation bilaterally, there is no findings to suggest pleural effusion or diminished air entry in the right base.  Chest wall:    No deformity  Heart:    S1 and S2 normal, no murmur, CVP - no.  Pressors - no  Abdomen:     Soft, no masses, no organomegaly  Genitalia:    Not done  Rectal:   not done  Extremities:   Extremities- intact some scattered bruises from fall     Skin:   Intact in exposed areas .     Neurologic:   Sedation - none -> RASS - na . Moves all 4s - yes. CAM-ICU - neg . Orientation - x3+          Assessment & Plan:     ICD-10-CM   1. Pneumonia of right lower lobe due to infectious organism ([place]) J18.1 DG Chest 2 View    Clinically resolved  Plan Await  radiologic resolution due to lag Repeat CXR2 view if you can stand up - do in 4 weeks [place] rec - try [employer] (spicy, less oily) or [employer] store (snack fast food)  Follow up - 4 weeks but after cxr - can see APP  - if anything changes please call or return sooner   Dr. Brand Males, M.D., [facility].C.P Pulmonary and Critical Care Medicine Staff Physician, [place] Director - Interstitial Lung Disease  Program  Pulmonary [place]  Network at [employer], [place], [address]  Pager: [phone], If no answer or between  15:00h - 7:00h: call [phone] Telephone: [phone]

## 2017-08-16 NOTE — Patient Instructions (Signed)
ICD-10-CM   1. Pneumonia of right lower lobe due to infectious organism ([place]) J18.1     Clinically resolved  Plan Await radiologic resolution Repeat CXR2 view if you can stand up - do in 4 weeks [place] rec - try [employer] (spicy, less oily) or [employer] store (snack fast food)  Follow up - 4 weeks but after cxr - can see APP  - if anything changes please call or return sooner

## 2017-08-24 ENCOUNTER — Inpatient Hospital Stay: Payer: Medicare HMO | Attending: Hematology and Oncology

## 2017-08-24 DIAGNOSIS — D509 Iron deficiency anemia, unspecified: Secondary | ICD-10-CM

## 2017-08-24 DIAGNOSIS — Z9181 History of falling: Secondary | ICD-10-CM | POA: Diagnosis not present

## 2017-08-24 DIAGNOSIS — D539 Nutritional anemia, unspecified: Secondary | ICD-10-CM

## 2017-08-24 DIAGNOSIS — C9310 Chronic myelomonocytic leukemia not having achieved remission: Secondary | ICD-10-CM

## 2017-08-24 DIAGNOSIS — D638 Anemia in other chronic diseases classified elsewhere: Secondary | ICD-10-CM | POA: Diagnosis not present

## 2017-08-24 DIAGNOSIS — M1990 Unspecified osteoarthritis, unspecified site: Secondary | ICD-10-CM | POA: Insufficient documentation

## 2017-08-24 DIAGNOSIS — Z79899 Other long term (current) drug therapy: Secondary | ICD-10-CM | POA: Insufficient documentation

## 2017-08-24 LAB — CBC WITH DIFFERENTIAL/PLATELET
BASOS ABS: 0 10*3/uL (ref 0.0–0.1)
BASOS PCT: 1 %
Eosinophils Absolute: 0.1 10*3/uL (ref 0.0–0.5)
Eosinophils Relative: 1 %
HCT: 34 % — ABNORMAL LOW (ref 38.4–49.9)
Hemoglobin: 10.4 g/dL — ABNORMAL LOW (ref 13.0–17.1)
LYMPHS PCT: 34 %
Lymphs Abs: 1.4 10*3/uL (ref 0.9–3.3)
MCH: 28.8 pg (ref 27.2–33.4)
MCHC: 30.6 g/dL — ABNORMAL LOW (ref 32.0–36.0)
MCV: 94.2 fL (ref 79.3–98.0)
MONOS PCT: 29 %
Monocytes Absolute: 1.2 10*3/uL — ABNORMAL HIGH (ref 0.1–0.9)
NEUTROS ABS: 1.5 10*3/uL (ref 1.5–6.5)
NEUTROS PCT: 35 %
Platelets: 331 10*3/uL (ref 140–400)
RBC: 3.61 MIL/uL — AB (ref 4.20–5.82)
RDW: 19.5 % — ABNORMAL HIGH (ref 11.0–14.6)
WBC: 4.2 10*3/uL (ref 4.0–10.3)

## 2017-08-24 LAB — BASIC METABOLIC PANEL - CANCER CENTER ONLY
ANION GAP: 5 (ref 3–11)
BUN: 29 mg/dL — ABNORMAL HIGH (ref 7–26)
CALCIUM: 9.8 mg/dL (ref 8.4–10.4)
CO2: 27 mmol/L (ref 22–29)
CREATININE: 1.19 mg/dL (ref 0.70–1.30)
Chloride: 108 mmol/L (ref 98–109)
GFR, EST NON AFRICAN AMERICAN: 53 mL/min — AB (ref >60)
Glucose, Bld: 107 mg/dL (ref 70–140)
Potassium: 3.8 mmol/L (ref 3.5–5.1)
SODIUM: 140 mmol/L (ref 136–145)

## 2017-08-24 LAB — IRON AND TIBC
Iron: 54 ug/dL (ref 42–163)
Saturation Ratios: 19 % — ABNORMAL LOW (ref 42–163)
TIBC: 290 ug/dL (ref 202–409)
UIBC: 236 ug/dL

## 2017-08-24 LAB — VITAMIN B12: Vitamin B-12: 950 pg/mL — ABNORMAL HIGH (ref 180–914)

## 2017-08-24 LAB — FERRITIN: Ferritin: 235 ng/mL (ref 22–316)

## 2017-08-24 LAB — SEDIMENTATION RATE: SED RATE: 25 mm/h — AB (ref 0–16)

## 2017-08-25 ENCOUNTER — Telehealth: Payer: Self-pay | Admitting: *Deleted

## 2017-08-25 LAB — ERYTHROPOIETIN: Erythropoietin: 5.4 m[IU]/mL (ref 2.6–18.5)

## 2017-08-25 NOTE — Telephone Encounter (Signed)
Notified of message below. Verbalized understanding. IV Iron appt cancelled

## 2017-08-25 NOTE — Telephone Encounter (Signed)
-----   Message from Heath Lark, MD sent at 08/25/2017  8:31 AM EDT ----- Regarding: no need iv iron I will see him next week Let him know labs so far ok, will not need iv iron next week. Ok to cancel iv iron

## 2017-08-30 DIAGNOSIS — H401131 Primary open-angle glaucoma, bilateral, mild stage: Secondary | ICD-10-CM | POA: Diagnosis not present

## 2017-08-31 ENCOUNTER — Telehealth: Payer: Self-pay | Admitting: Hematology and Oncology

## 2017-08-31 ENCOUNTER — Ambulatory Visit: Payer: Medicare HMO

## 2017-08-31 ENCOUNTER — Encounter: Payer: Self-pay | Admitting: Hematology and Oncology

## 2017-08-31 ENCOUNTER — Inpatient Hospital Stay (HOSPITAL_BASED_OUTPATIENT_CLINIC_OR_DEPARTMENT_OTHER): Payer: Medicare HMO | Admitting: Hematology and Oncology

## 2017-08-31 DIAGNOSIS — Z79899 Other long term (current) drug therapy: Secondary | ICD-10-CM | POA: Diagnosis not present

## 2017-08-31 DIAGNOSIS — D509 Iron deficiency anemia, unspecified: Secondary | ICD-10-CM

## 2017-08-31 DIAGNOSIS — Z9181 History of falling: Secondary | ICD-10-CM | POA: Diagnosis not present

## 2017-08-31 DIAGNOSIS — M1990 Unspecified osteoarthritis, unspecified site: Secondary | ICD-10-CM | POA: Diagnosis not present

## 2017-08-31 DIAGNOSIS — D638 Anemia in other chronic diseases classified elsewhere: Secondary | ICD-10-CM

## 2017-08-31 DIAGNOSIS — C9310 Chronic myelomonocytic leukemia not having achieved remission: Secondary | ICD-10-CM | POA: Diagnosis not present

## 2017-08-31 NOTE — Assessment & Plan Note (Signed)
The monocyte count is stable He has mild anemia of chronic illness not requiring treatment I plan to see him back in 1 year

## 2017-08-31 NOTE — Assessment & Plan Note (Addendum)
The patient also had history of anemia chronic illness and iron deficiency anemia Iron studies are good except for mild iron saturation I recommend him to continue on low-dose oral iron supplement periodically as tolerated

## 2017-08-31 NOTE — Telephone Encounter (Signed)
Gave patient AVs and calendar of upcoming May appointments.  °

## 2017-08-31 NOTE — Progress Notes (Signed)
[place] OFFICE PROGRESS NOTE  Patient Care Team: Rankins, Bill Salinas, MD as PCP - General (Family Medicine)  ASSESSMENT & PLAN:  CMML (chronic myelomonocytic leukemia) ([place]) The monocyte count is stable He has mild anemia of chronic illness not requiring treatment I plan to see him back in 1 year  Anemia in chronic illness The patient also had history of anemia chronic illness and iron deficiency anemia Iron studies are good except for mild iron saturation I recommend him to continue on low-dose oral iron supplement periodically as tolerated   No orders of the defined types were placed in this encounter.   INTERVAL HISTORY: Please see below for problem oriented charting. He returns with his wife today. He felt better since the last time I saw him.  He is taking iron supplement on a regular basis He denies recent infection, fever or chills The patient denies any recent signs or symptoms of bleeding such as spontaneous epistaxis, hematuria or hematochezia.  SUMMARY OF ONCOLOGIC HISTORY: Oncology History   Calculated IPSS-R: 4 points (cytogenetics 1 point for normal; bone marrow blasts 2 points for 5%; 1 point for hemaoglobin < 10). Overall score is in the intermediate risk with median OS 3 years and 25% AML evolution in 3 years      CMML (chronic myelomonocytic leukemia) ([place])   12/25/2015 Bone Marrow Biopsy    Accession: [id number] bone marrow biopsy is consistent with CMML, 5% blast with associated anemia.      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 Calculated IPSS-R: 4 points (cytogenetics 1 point for normal; bone marrow blasts 2 points for 5%; 1 point for hemaoglobin < 10). 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       REVIEW OF SYSTEMS:   Constitutional:  Denies fevers, chills or abnormal weight loss Eyes: Denies blurriness of vision Ears, nose, mouth, throat, and face: Denies mucositis or sore throat Respiratory: Denies cough, dyspnea or wheezes Cardiovascular: Denies palpitation, chest discomfort or lower extremity swelling Gastrointestinal:  Denies nausea, heartburn or change in bowel habits Skin: Denies abnormal skin rashes Lymphatics: Denies new lymphadenopathy or easy bruising Neurological:Denies numbness, tingling or new weaknesses Behavioral/Psych: Mood is stable, no new changes  All other systems were reviewed with the patient and are negative.  I have reviewed the past medical history, past surgical history, social history and family history with the patient and they are unchanged from previous note.  ALLERGIES:  is allergic to prednisone and tramadol.  MEDICATIONS:  Current Outpatient Medications  Medication Sig Dispense Refill  . Cholecalciferol (VITAMIN D) 2000 units tablet Take 2,000 Units by mouth daily.    . dorzolamide-timolol (COSOPT) 22.3-6.8 MG/ML ophthalmic solution Place 1 drop into both eyes 2 (two) times daily.    [place] [facility] latanoprost (XALATAN) 0.005 % ophthalmic solution Place 1 drop into both eyes daily.    . Misc Natural Products (PROSTATE) CAPS Take 2 capsules by mouth daily.    . Multiple Vitamins-Minerals (MULTIVITAMIN & MINERAL PO) Take 1 tablet by mouth daily.    . traZODone (DESYREL) 50 MG tablet   0   No current facility-administered medications for this visit.     PHYSICAL EXAMINATION: ECOG PERFORMANCE STATUS: 0 - Asymptomatic  Vitals:   08/31/17 1112  BP: (!) 151/87  Pulse: 70  Resp: 18  Temp: 97.7 F (36.5 C)  SpO2: 100%   Filed Weights   08/31/17 1112  Weight: 157 lb 1.6 oz (71.3 kg)    GENERAL:alert, no distress and comfortable NEURO: alert & oriented x 3 with fluent speech, no focal motor/sensory deficits  LABORATORY DATA:  I have reviewed the data as listed    Component Value  Date/Time   NA 140 08/24/2017 1049   NA 139 12/24/2015 1230   K 3.8 08/24/2017 1049   K 3.8 12/24/2015 1230   CL 108 08/24/2017 1049   CO2 27 08/24/2017 1049   CO2 23 12/24/2015 1230   GLUCOSE 107 08/24/2017 1049   GLUCOSE 124 12/24/2015 1230   BUN 29 (H) 08/24/2017 1049   BUN 21.6 12/24/2015 1230   CREATININE 1.19 08/24/2017 1049   CREATININE 0.8 12/24/2015 1230   CALCIUM 9.8 08/24/2017 1049   CALCIUM 9.5 12/24/2015 1230   PROT 7.0 12/24/2015 1230   ALBUMIN 3.0 (L) 12/24/2015 1230   AST 36 (H) 12/24/2015 1230   ALT 32 12/24/2015 1230   ALKPHOS 72 12/24/2015 1230   BILITOT 0.63 12/24/2015 1230   GFRNONAA 53 (L) 08/24/2017 1049   GFRAA >60 08/24/2017 1049    No results found for: SPEP, UPEP  Lab Results  Component Value Date   WBC 4.2 08/24/2017   NEUTROABS 1.5 08/24/2017   HGB 10.4 (L) 08/24/2017   HCT 34.0 (L) 08/24/2017   MCV 94.2 08/24/2017   PLT 331 08/24/2017      Chemistry      Component Value Date/Time   NA 140 08/24/2017 1049   NA 139 12/24/2015 1230   K 3.8 08/24/2017 1049   K 3.8 12/24/2015 1230   CL 108 08/24/2017 1049   CO2 27 08/24/2017 1049   CO2 23 12/24/2015 1230   BUN 29 (H) 08/24/2017 1049   BUN 21.6 12/24/2015 1230   CREATININE 1.19 08/24/2017 1049   CREATININE 0.8 12/24/2015 1230      Component Value Date/Time   CALCIUM 9.8 08/24/2017 1049   CALCIUM 9.5 12/24/2015 1230   ALKPHOS 72 12/24/2015 1230   AST 36 (H) 12/24/2015 1230   ALT 32 12/24/2015 1230   BILITOT 0.63 12/24/2015 1230       RADIOGRAPHIC STUDIES: I have personally reviewed the radiological images as listed and agreed with the findings in the report. Dg Nasal Bones  Result Date: 08/03/2017 CLINICAL DATA:  Laceration across the nose after fall. EXAM: NASAL BONES - 3+ VIEW COMPARISON:  None. FINDINGS: Left nasal tip fracture is noted with slight dorsal angulation and with overlying soft tissue swelling IMPRESSION: Left nasal tip fracture. Electronically Signed   By:  Ashley Royalty M.D.   On: 08/03/2017 20:57   Dg Chest 2 View  Result Date: 08/03/2017 CLINICAL DATA:  Follow-up pneumonia EXAM: CHEST - 2 VIEW COMPARISON:  None. FINDINGS: Consolidation in the right lower lobe compatible with pneumonia. Mild hyperinflation. Mild cardiomegaly. Patchy opacities in the left lung base could also reflect pneumonia. No effusions or acute bony abnormality. IMPRESSION: Dense consolidation in the right lower lobe compatible with pneumonia. Patchy opacities in the left base could also reflect pneumonia. Cardiomegaly.  Hyperinflation. Electronically Signed   By: Rolm Baptise M.D.   On: 08/03/2017 10:08   Ct Head Wo Contrast  Result Date: 08/03/2017 CLINICAL DATA:  Fall EXAM: CT HEAD WITHOUT CONTRAST CT MAXILLOFACIAL WITHOUT CONTRAST TECHNIQUE: Multidetector CT imaging of the head and maxillofacial  structures were performed using the standard protocol without intravenous contrast. Multiplanar CT image reconstructions of the maxillofacial structures were also generated. COMPARISON:  None. FINDINGS: CT HEAD FINDINGS Brain: No intracranial hemorrhage. No parenchymal contusion. No midline shift or mass effect. Basilar cisterns are patent. No skull base fracture. No fluid in the paranasal sinuses or mastoid air cells. Orbits are normal. There are periventricular and subcortical white matter hypodensities. Generalized cortical atrophy. Vascular: No hyperdense vessel or unexpected calcification. Skull: Normal. Negative for fracture or focal lesion. Sinuses/Orbits: Paranasal sinuses and mastoid air cells are clear. Orbits are clear. Other: None. CT MAXILLOFACIAL FINDINGS Osseous: Orbital walls are intact. Zygomatic arches are intact. No maxillary bone fracture. No nasal bone fracture evident. Pterygoid plates are normal. Mandibular condyles are located. No mandibular fracture. Orbits: Negative. No traumatic or inflammatory finding. Sinuses: No fluid in the paranasal sinuses Soft tissues: Soft  tissues laceration over the bridge of the nose IMPRESSION: 1. No intracranial trauma. 2. Atrophy and microvascular disease. 3. No facial bone fracture. Electronically Signed   By: Suzy Bouchard M.D.   On: 08/03/2017 23:29   Dg Hand Complete Left  Result Date: 08/03/2017 CLINICAL DATA:  Left hand pain after fall today. EXAM: LEFT HAND - COMPLETE 3+ VIEW COMPARISON:  None. FINDINGS: Normal bone mineralization. Suspect fracture involving the base of the thumb metacarpal given what appears to be slight displacement involving the volar ulnar cortex of the metacarpal. Osteoarthritis of the DIP and PIP joints of the second through fifth digits, interphalangeal joint of the thumb as well as at the base of the thumb metacarpal. Chondrocalcinosis a triangular fibrocartilage is noted as well as radiocarpal joint cartilage. This can be seen in calcium pyrophosphate deposition disease (pseudogout). Ankylosed appearance of the fifth DIP joint. Erosive osteoarthritic appearance of the second and third DIP joints. IMPRESSION: Cortical irregularity at the base of the thumb metacarpal consistent with a minimally displaced fracture. No definite intra-articular involvement. Osteoarthritis of the peripheral joints of the left hand as well as base of thumb metacarpal and first MCP. Electronically Signed   By: Ashley Royalty M.D.   On: 08/03/2017 20:55   Ct Maxillofacial Wo Cm  Result Date: 08/03/2017 CLINICAL DATA:  Fall EXAM: CT HEAD WITHOUT CONTRAST CT MAXILLOFACIAL WITHOUT CONTRAST TECHNIQUE: Multidetector CT imaging of the head and maxillofacial structures were performed using the standard protocol without intravenous contrast. Multiplanar CT image reconstructions of the maxillofacial structures were also generated. COMPARISON:  None. FINDINGS: CT HEAD FINDINGS Brain: No intracranial hemorrhage. No parenchymal contusion. No midline shift or mass effect. Basilar cisterns are patent. No skull base fracture. No fluid in the  paranasal sinuses or mastoid air cells. Orbits are normal. There are periventricular and subcortical white matter hypodensities. Generalized cortical atrophy. Vascular: No hyperdense vessel or unexpected calcification. Skull: Normal. Negative for fracture or focal lesion. Sinuses/Orbits: Paranasal sinuses and mastoid air cells are clear. Orbits are clear. Other: None. CT MAXILLOFACIAL FINDINGS Osseous: Orbital walls are intact. Zygomatic arches are intact. No maxillary bone fracture. No nasal bone fracture evident. Pterygoid plates are normal. Mandibular condyles are located. No mandibular fracture. Orbits: Negative. No traumatic or inflammatory finding. Sinuses: No fluid in the paranasal sinuses Soft tissues: Soft tissues laceration over the bridge of the nose IMPRESSION: 1. No intracranial trauma. 2. Atrophy and microvascular disease. 3. No facial bone fracture. Electronically Signed   By: Suzy Bouchard M.D.   On: 08/03/2017 23:29    All questions were answered. The patient knows to call the  clinic with any problems, questions or concerns. No barriers to learning was detected.  I spent 10 minutes counseling the patient face to face. The total time spent in the appointment was 15 minutes and more than 50% was on counseling and review of test results  Heath Lark, MD 08/31/2017 12:47 PM

## 2017-09-20 ENCOUNTER — Ambulatory Visit (INDEPENDENT_AMBULATORY_CARE_PROVIDER_SITE_OTHER): Payer: Medicare HMO | Admitting: Internal Medicine

## 2017-09-20 ENCOUNTER — Encounter: Payer: Self-pay | Admitting: Internal Medicine

## 2017-09-20 ENCOUNTER — Ambulatory Visit (INDEPENDENT_AMBULATORY_CARE_PROVIDER_SITE_OTHER)
Admission: RE | Admit: 2017-09-20 | Discharge: 2017-09-20 | Disposition: A | Discharge status: Home / Self Care | Payer: Medicare HMO | Source: Ambulatory Visit | Attending: Internal Medicine | Admitting: Internal Medicine

## 2017-09-20 VITALS — BP 130/80 | HR 70 | Ht 71.0 in | Wt 157.4 lb

## 2017-09-20 DIAGNOSIS — J189 Pneumonia, unspecified organism: Secondary | ICD-10-CM | POA: Diagnosis not present

## 2017-09-20 DIAGNOSIS — J181 Lobar pneumonia, unspecified organism: Secondary | ICD-10-CM | POA: Diagnosis not present

## 2017-09-20 NOTE — Patient Instructions (Signed)
ICD-10-CM   1. Pneumonia of right lower lobe due to infectious organism ([place]) J18.1     Clinically and radiologically resolved  Plan As needed followup

## 2017-09-20 NOTE — Progress Notes (Signed)
Subjective:     Patient ID: Stephen Jones, male   DOB: 15-Jan-1929, 84 y.o.   MRN: [id number]  HPI  IOV 08/16/2017  Chief Complaint  Patient presents with  . Consult    Referred by Dr. Radene Ou due to rt lower lobe pna.  Pt was diagnosed with pna about a month ago. Denies any complaints of cough, SOB, or CP.    84 year old male accompanied by his wife.  He has CML with anemia.  In addition he has frail muscular strength and uses a wheelchair.  He does transfer to the dining table from his wheelchair.  According to him and his wife in February 2019 they both simultaneously and in parallel had respiratory symptoms suggestive of a flulike illness.  They both recovered from it.  However this thing relapsed again in March 2019.  The wife recovered spontaneously without any antibiotics but the patient/husband did not.  Then sometime between the first week of April 2019 so primary care physician and had a chest x-ray.  They got a phone call saying he had pneumonia and was given doxycycline.  With the doxycycline course his symptoms improved and he resolved.  10 days later August 03, 2017 they had a repeat chest x-ray.  This is the only chest x-ray that is available for my personal visualization.  I personally visualize this and agree with the findings of right lower lobe consolidation.  But at this time he was asymptomatic.  He was then given a course of Levaquin greater than 5 or 7 days.  He took this and did not feel any different.  This is because symptoms had already resolved.  Therefore this referral was made and he is here to see me today Aug 16, 2017 and he feels at baseline health.  There are no hemoptysis or chest pain or pedal edema or wheezing or cough or respiratory distress or shortness of breath or weight loss   IMPRESSION: Dense consolidation in the right lower lobe compatible with pneumonia. Patchy opacities in the left base could also reflect pneumonia.  Cardiomegaly.   Hyperinflation.   Electronically Signed   By: Rolm Baptise M.D.   On: 08/03/2017 10:08   OV 09/20/2017  Chief Complaint  Patient presents with  . Follow-up    Follow up from pna.  Cxr performed today.  Pt states he has been doing good since last visit and denies any complaints.    Follow-up right lower lobe pneumonia  When I saw Stephen Jones early May 2019 his pneumonia had clinically resolved but the chest x-ray from mid April 2019 still had infiltrates.  Therefore we decided to follow him up radiologically today.  In the interim he has no complaints.  No dysphagia no falls no chest pain no shortness of breath no hemoptysis no orthopnea no proximal nocturnal dyspnea no edema no wheezing.  He continues to do well.  No interim new medical problems.  Dg Chest 2 View  Result Date: 09/20/2017 CLINICAL DATA:  Follow-up of pneumonia. EXAM: CHEST - 2 VIEW COMPARISON:  08/03/2017 FINDINGS: Cardiomediastinal silhouette is normal. Calcific atherosclerotic disease of the aorta. Mediastinal contours appear intact. There is no evidence of focal airspace consolidation, pleural effusion or pneumothorax. Interval resolution of previously seen bilateral lower lobe airspace opacities. Osseous structures are without acute abnormality. Soft tissues are grossly normal. IMPRESSION: No residual airspace consolidation. Calcific atherosclerotic disease of the aorta. Electronically Signed   By: Fidela Salisbury M.D.   On: 09/20/2017 09:52  has a past medical history of Anemia, Glaucoma, Osteoarthritis, and Skin cancer.   reports that he has never smoked. He has never used smokeless tobacco.  Past Surgical History:  Procedure Laterality Date  . cataracts    . SKIN SURGERY      Allergies  Allergen Reactions  . Prednisone     Upsets stomach  . Tramadol     Weakness, spaced out feeling    Immunization History  Administered Date(s) Administered  . Influenza, High Dose Seasonal PF  12/31/2016  . Influenza,inj,Quad PF,6+ Mos 12/31/2015  . Tdap 08/03/2017    Family History  Problem Relation Age of Onset  . Cancer Mother        colon cancer  . Cancer Son        hodgkin     Current Outpatient Medications:  .  Cholecalciferol (VITAMIN D) 2000 units tablet, Take 2,000 Units by mouth daily., Disp: , Rfl:  .  dorzolamide-timolol (COSOPT) 22.3-6.8 MG/ML ophthalmic solution, Place 1 drop into both eyes 2 (two) times daily., Disp: , Rfl:  .  IRON PO, Take 1 capsule by mouth daily., Disp: , Rfl:  .  latanoprost (XALATAN) 0.005 % ophthalmic solution, Place 1 drop into both eyes daily., Disp: , Rfl:  .  Misc Natural Products (PROSTATE) CAPS, Take 2 capsules by mouth daily., Disp: , Rfl:  .  Multiple Vitamins-Minerals (MULTIVITAMIN & MINERAL PO), Take 1 tablet by mouth daily., Disp: , Rfl:  .  traZODone (DESYREL) 50 MG tablet, , Disp: , Rfl: 0   Review of Systems     Objective:   Physical Exam Vitals:   09/20/17 1004  BP: 130/80  Pulse: 70  SpO2: 99%  Weight: 157 lb 6.4 oz (71.4 kg)  Height: 5\' 11"  (1.803 m)    Estimated body mass index is 21.95 kg/m as calculated from the following:   Height as of this encounter: 5\' 11"  (1.803 m).   Weight as of this encounter: 157 lb 6.4 oz (71.4 kg).  Pleasant looking male seated on the wheelchair.  Alert and oriented x3.  Skin exam shows scattered nevi.  He has no edema no clubbing no pedal edema no musculoskeletal joint deformities.  Heart sounds are normal.  Abdomen is soft lungs are clear to auscultation without any wheezing or distress     Assessment:       ICD-10-CM   1. Pneumonia of right lower lobe due to infectious organism ([place]) J18.1        Plan:       Clinically and radiologically resolved  Plan As needed followup    Dr. Brand Males, M.D., [facility].C.P Pulmonary and Critical Care Medicine Staff Physician, Cricket Director - Interstitial Lung Disease  Program  Pulmonary  [facility] [place], [place], [address]  Pager: [phone], If no answer or between  15:00h - 7:00h: call [phone] Telephone: [phone]

## 2017-12-01 DIAGNOSIS — C9310 Chronic myelomonocytic leukemia not having achieved remission: Secondary | ICD-10-CM | POA: Diagnosis not present

## 2017-12-01 DIAGNOSIS — R030 Elevated blood-pressure reading, without diagnosis of hypertension: Secondary | ICD-10-CM | POA: Diagnosis not present

## 2017-12-01 DIAGNOSIS — G479 Sleep disorder, unspecified: Secondary | ICD-10-CM | POA: Diagnosis not present

## 2018-02-12 DIAGNOSIS — Z23 Encounter for immunization: Secondary | ICD-10-CM | POA: Diagnosis not present

## 2018-02-12 DIAGNOSIS — R030 Elevated blood-pressure reading, without diagnosis of hypertension: Secondary | ICD-10-CM | POA: Diagnosis not present

## 2018-02-12 DIAGNOSIS — C9310 Chronic myelomonocytic leukemia not having achieved remission: Secondary | ICD-10-CM | POA: Diagnosis not present

## 2018-02-12 DIAGNOSIS — G479 Sleep disorder, unspecified: Secondary | ICD-10-CM | POA: Diagnosis not present

## 2018-03-21 DIAGNOSIS — H524 Presbyopia: Secondary | ICD-10-CM | POA: Diagnosis not present

## 2018-07-02 IMAGING — DX DG NASAL BONES 3+V
3 series · 3 of 3 positions shown · non-contrast
Comparison: None.

CLINICAL DATA: Laceration across the nose after fall.

EXAM:
NASAL BONES - 3+ VIEW

[nasal lat (1 of 2)]
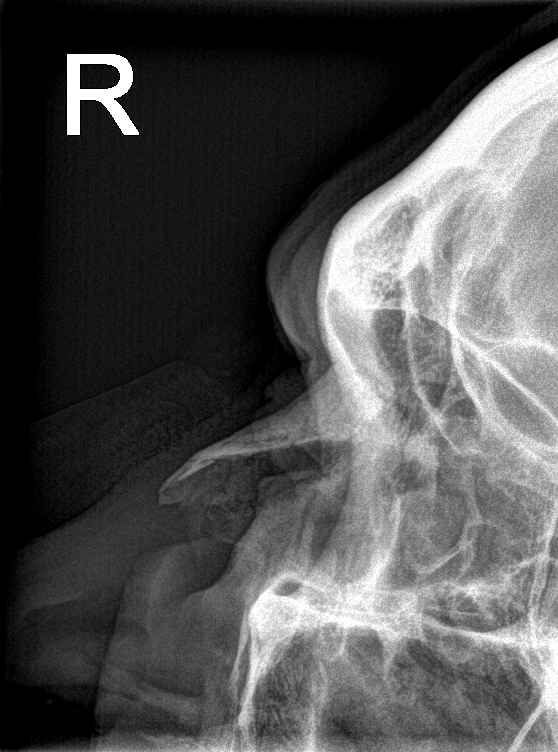

[nasal waters]
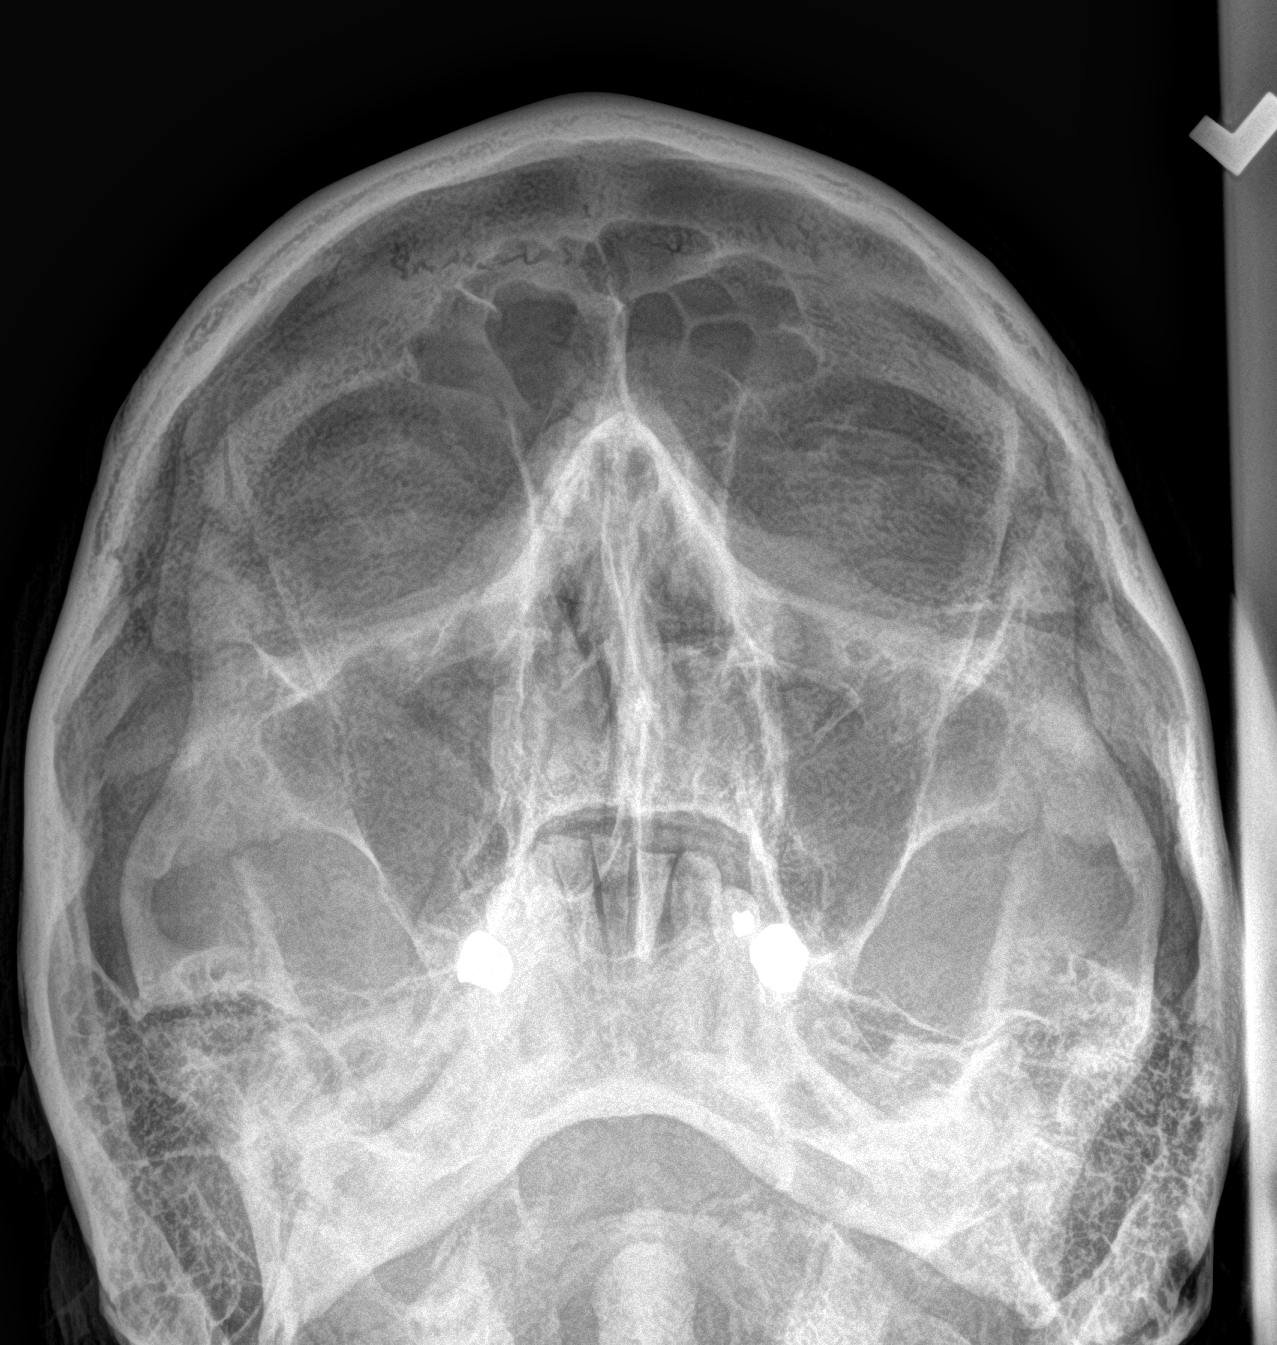

[nasal lat (2 of 2)]
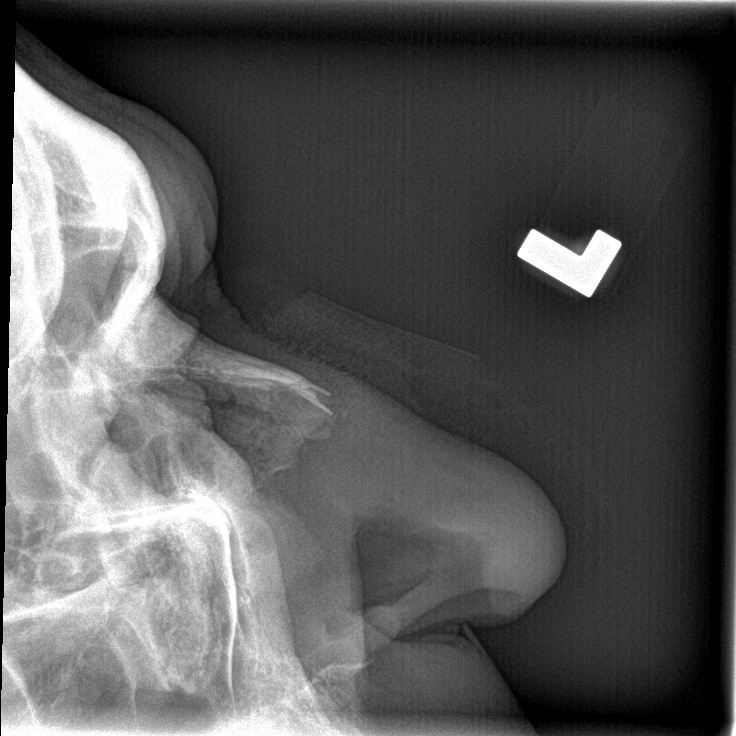

[3 of 3 positions shown; findings below may reference images not displayed]

FINDINGS: Left nasal tip fracture is noted with slight dorsal angulation and
with overlying soft tissue swelling
IMPRESSION: Left nasal tip fracture.

## 2018-08-19 IMAGING — DX DG CHEST 2V
2 series · 2 of 2 positions shown · non-contrast
Comparison: 08/03/2017

CLINICAL DATA: Follow-up of pneumonia.

EXAM:
CHEST - 2 VIEW

[chest lat]
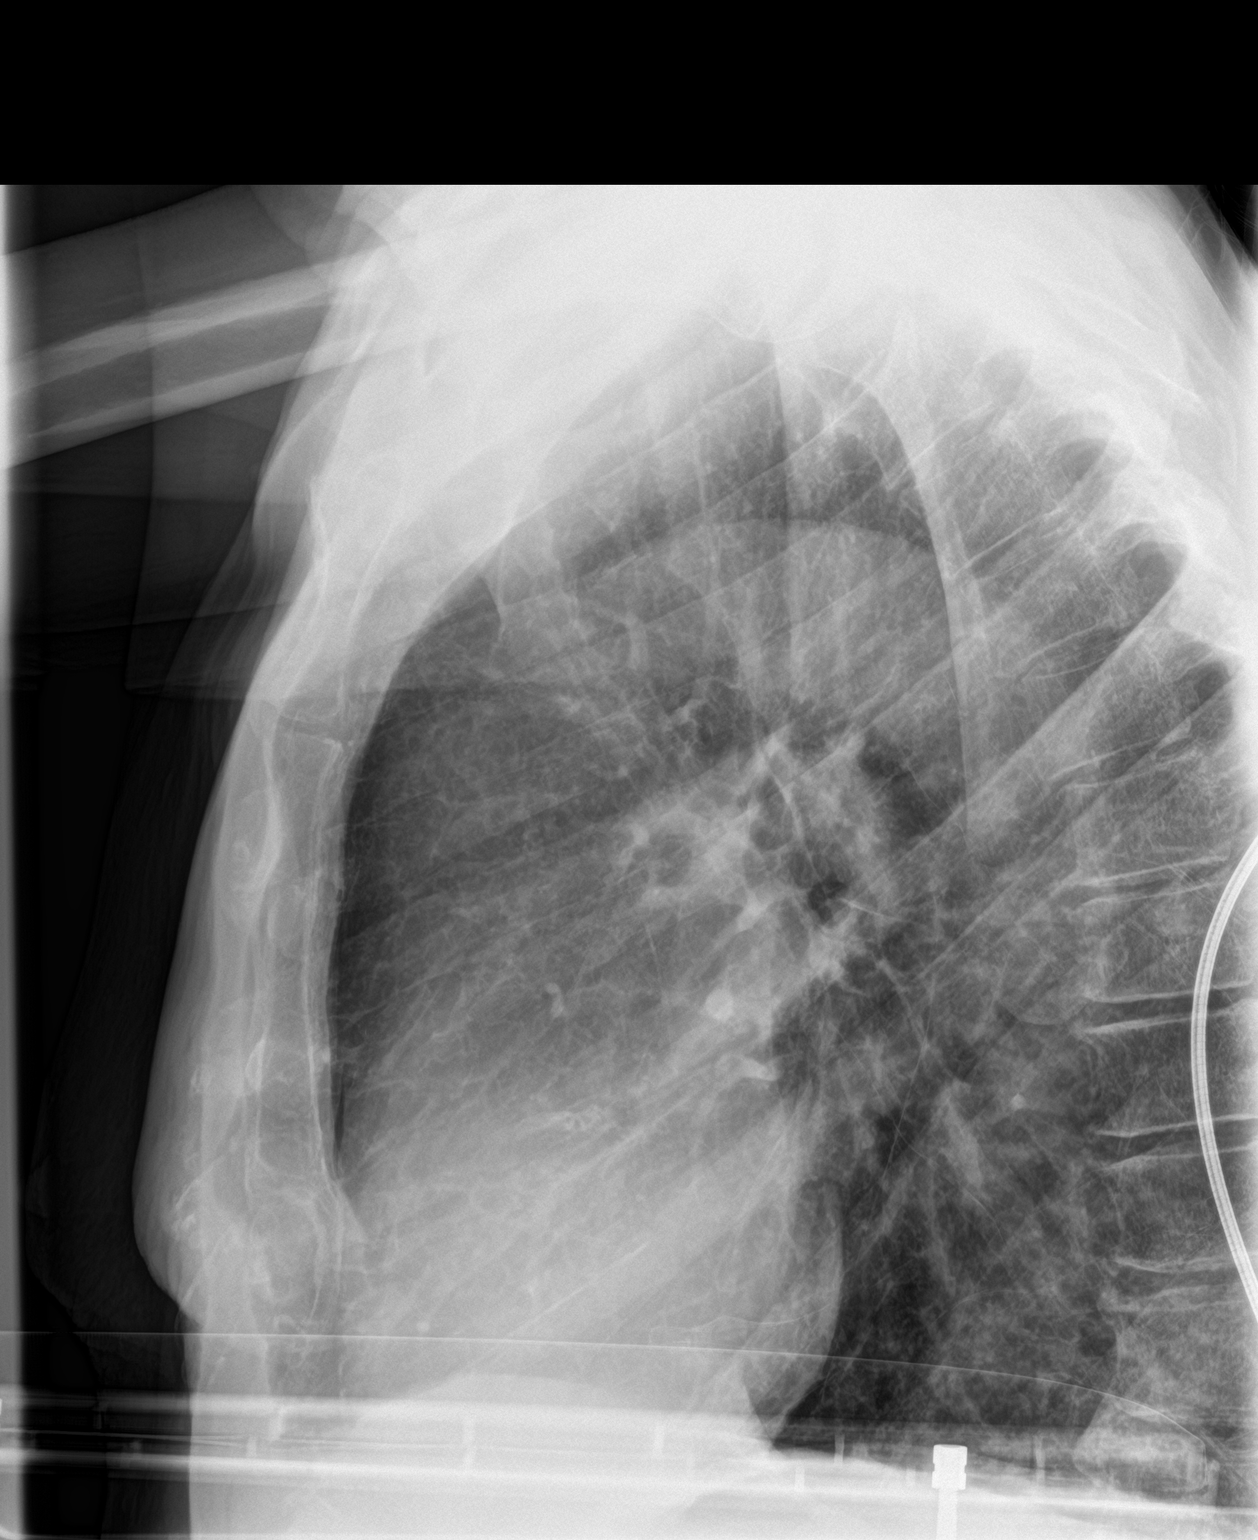

[chest ap]
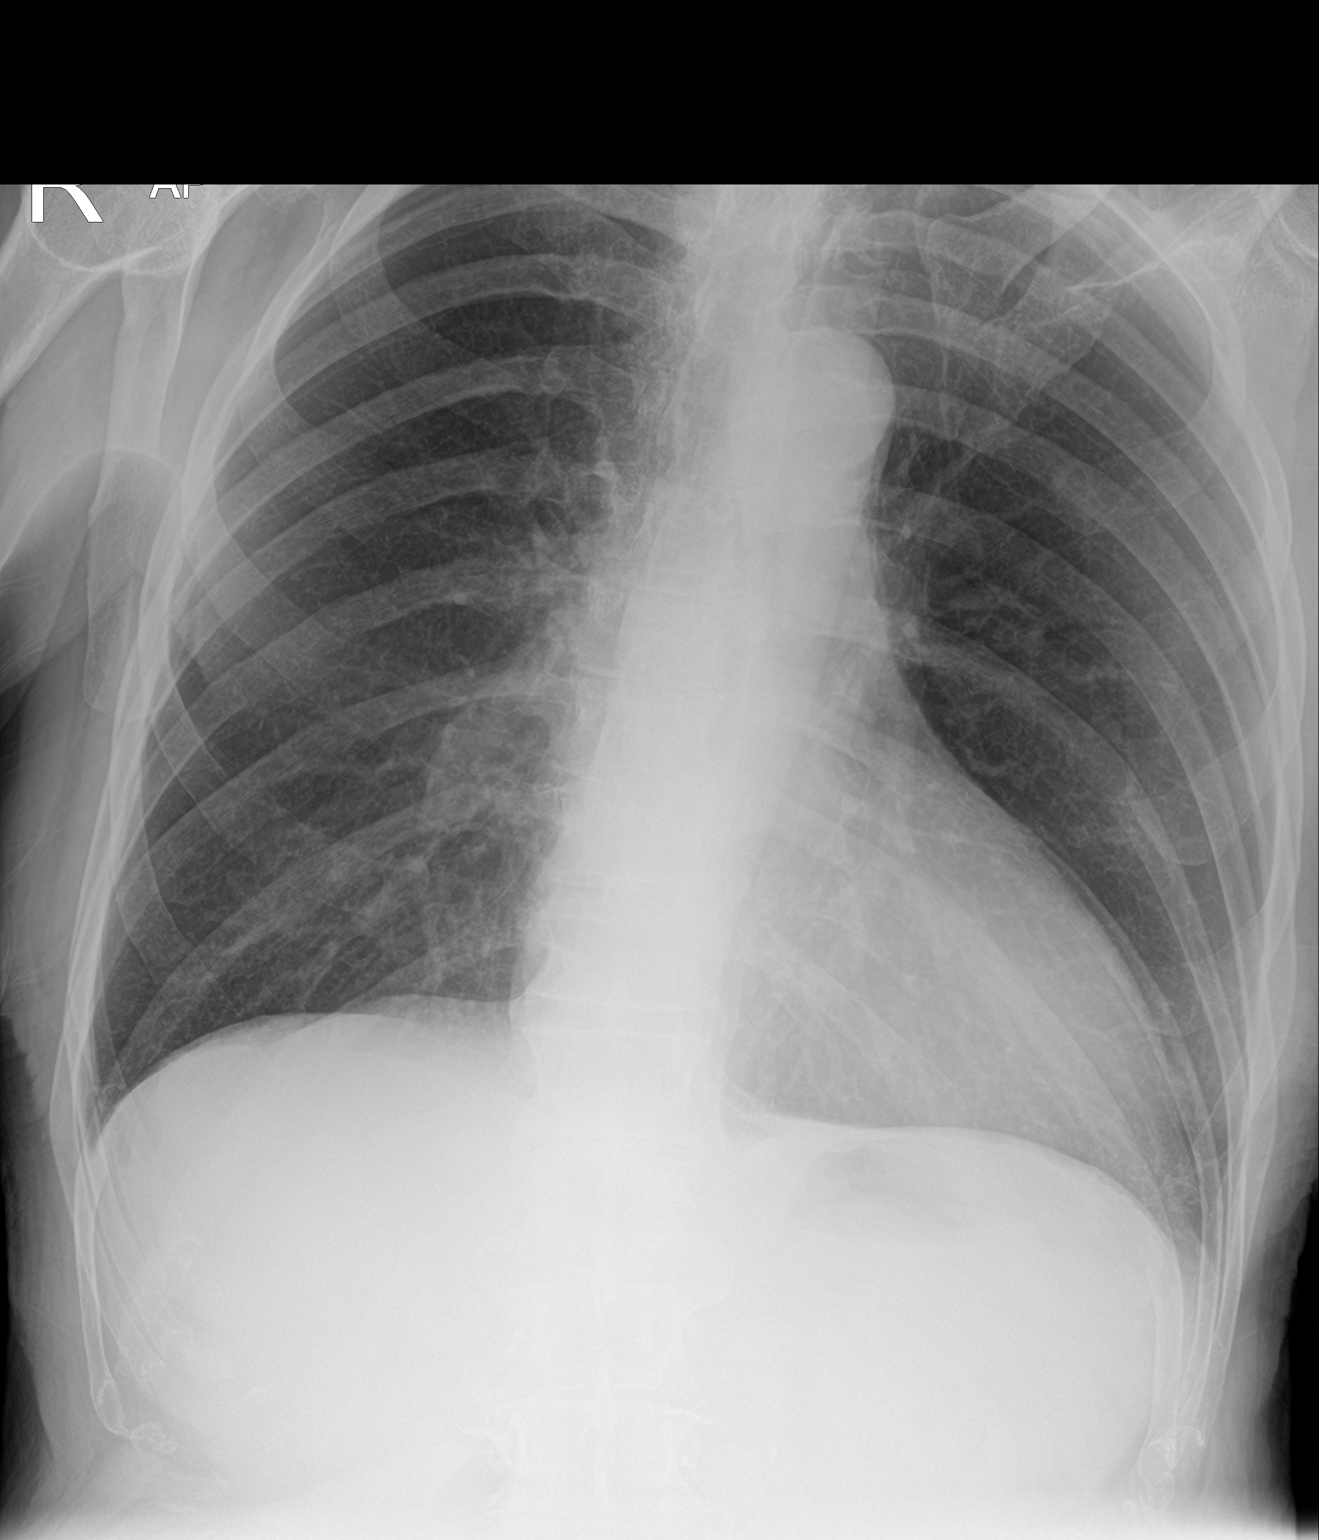

[2 of 2 positions shown; findings below may reference images not displayed]

FINDINGS: Cardiomediastinal silhouette is normal. Calcific atherosclerotic
disease of the aorta. Mediastinal contours appear intact.

There is no evidence of focal airspace consolidation, pleural
effusion or pneumothorax. Interval resolution of previously seen
bilateral lower lobe airspace opacities.

Osseous structures are without acute abnormality. Soft tissues are
grossly normal.
IMPRESSION: No residual airspace consolidation.

Calcific atherosclerotic disease of the aorta.

## 2018-08-27 ENCOUNTER — Encounter: Payer: Self-pay | Admitting: Hematology and Oncology

## 2018-08-27 ENCOUNTER — Telehealth: Payer: Self-pay | Admitting: Hematology and Oncology

## 2018-08-27 NOTE — Telephone Encounter (Signed)
Scheduled appts per sch msg. Called patient and left detailed msg.

## 2018-08-30 ENCOUNTER — Ambulatory Visit: Payer: Medicare HMO | Admitting: Hematology and Oncology

## 2018-08-30 ENCOUNTER — Other Ambulatory Visit: Payer: Medicare HMO

## 2018-10-08 ENCOUNTER — Ambulatory Visit: Payer: Medicare HMO | Admitting: Hematology and Oncology

## 2018-10-08 ENCOUNTER — Other Ambulatory Visit: Payer: Medicare HMO

## 2018-11-20 ENCOUNTER — Other Ambulatory Visit: Payer: Self-pay | Admitting: Hematology and Oncology

## 2018-11-20 DIAGNOSIS — D509 Iron deficiency anemia, unspecified: Secondary | ICD-10-CM

## 2018-11-20 DIAGNOSIS — C9310 Chronic myelomonocytic leukemia not having achieved remission: Secondary | ICD-10-CM

## 2018-11-21 DIAGNOSIS — H401130 Primary open-angle glaucoma, bilateral, stage unspecified: Secondary | ICD-10-CM | POA: Diagnosis not present

## 2018-11-22 ENCOUNTER — Encounter: Payer: Self-pay | Admitting: Hematology and Oncology

## 2018-11-27 ENCOUNTER — Ambulatory Visit: Payer: Medicare HMO | Admitting: Hematology and Oncology

## 2018-11-27 ENCOUNTER — Inpatient Hospital Stay: Payer: Medicare HMO

## 2019-02-07 DIAGNOSIS — Z23 Encounter for immunization: Secondary | ICD-10-CM | POA: Diagnosis not present

## 2019-02-21 ENCOUNTER — Inpatient Hospital Stay: Payer: Medicare HMO | Attending: Hematology and Oncology | Admitting: Hematology and Oncology

## 2019-02-21 ENCOUNTER — Inpatient Hospital Stay: Payer: Medicare HMO

## 2019-02-21 ENCOUNTER — Other Ambulatory Visit: Payer: Self-pay

## 2019-02-21 ENCOUNTER — Telehealth: Payer: Self-pay

## 2019-02-21 ENCOUNTER — Encounter: Payer: Self-pay | Admitting: Hematology and Oncology

## 2019-02-21 ENCOUNTER — Telehealth: Payer: Self-pay | Admitting: Hematology and Oncology

## 2019-02-21 VITALS — BP 170/95 | HR 69 | Temp 98.3°F | Resp 17 | Ht 71.0 in | Wt 167.4 lb

## 2019-02-21 DIAGNOSIS — D638 Anemia in other chronic diseases classified elsewhere: Secondary | ICD-10-CM

## 2019-02-21 DIAGNOSIS — Z79899 Other long term (current) drug therapy: Secondary | ICD-10-CM | POA: Diagnosis not present

## 2019-02-21 DIAGNOSIS — C9310 Chronic myelomonocytic leukemia not having achieved remission: Secondary | ICD-10-CM | POA: Diagnosis not present

## 2019-02-21 DIAGNOSIS — I10 Essential (primary) hypertension: Secondary | ICD-10-CM

## 2019-02-21 DIAGNOSIS — D539 Nutritional anemia, unspecified: Secondary | ICD-10-CM | POA: Diagnosis not present

## 2019-02-21 DIAGNOSIS — D509 Iron deficiency anemia, unspecified: Secondary | ICD-10-CM

## 2019-02-21 LAB — CBC WITH DIFFERENTIAL/PLATELET
Abs Immature Granulocytes: 0.03 10*3/uL (ref 0.00–0.07)
Basophils Absolute: 0 10*3/uL (ref 0.0–0.1)
Basophils Relative: 1 %
Eosinophils Absolute: 0.1 10*3/uL (ref 0.0–0.5)
Eosinophils Relative: 2 %
HCT: 35.5 % — ABNORMAL LOW (ref 39.0–52.0)
Hemoglobin: 11.1 g/dL — ABNORMAL LOW (ref 13.0–17.0)
Immature Granulocytes: 1 %
Lymphocytes Relative: 23 %
Lymphs Abs: 1 10*3/uL (ref 0.7–4.0)
MCH: 31.8 pg (ref 26.0–34.0)
MCHC: 31.3 g/dL (ref 30.0–36.0)
MCV: 101.7 fL — ABNORMAL HIGH (ref 80.0–100.0)
Monocytes Absolute: 1.2 10*3/uL — ABNORMAL HIGH (ref 0.1–1.0)
Monocytes Relative: 28 %
Neutro Abs: 2.1 10*3/uL (ref 1.7–7.7)
Neutrophils Relative %: 45 %
Platelets: 312 10*3/uL (ref 150–400)
RBC: 3.49 MIL/uL — ABNORMAL LOW (ref 4.22–5.81)
RDW: 14.6 % (ref 11.5–15.5)
WBC: 4.5 10*3/uL (ref 4.0–10.5)
nRBC: 0 % (ref 0.0–0.2)

## 2019-02-21 LAB — IRON AND TIBC
Iron: 71 ug/dL (ref 42–163)
Saturation Ratios: 23 % (ref 20–55)
TIBC: 305 ug/dL (ref 202–409)
UIBC: 234 ug/dL (ref 117–376)

## 2019-02-21 LAB — FERRITIN: Ferritin: 143 ng/mL (ref 24–336)

## 2019-02-21 NOTE — Telephone Encounter (Signed)
-----   Message from Heath Lark, MD sent at 02/21/2019 10:21 AM EST ----- Regarding: pls call him/wife: iron studies are good. continue every other day OTC iron

## 2019-02-21 NOTE — Assessment & Plan Note (Signed)
The patient is noted to have chronic hypertension but he is not taking blood pressure medication He has not seen his primary care doctor for some time We discussed the importance of risk factor modification and the importance of close follow-up with primary care doctor for possible medical management to treat his blood pressure aggressively We also discussed the importance of using home blood pressure monitoring

## 2019-02-21 NOTE — Assessment & Plan Note (Addendum)
The patient also had history of anemia chronic illness and iron deficiency anemia He is not symptomatic from anemia standpoint His iron studies are good. He tolerated oral iron supplement well He will continue to take oral iron supplement indefinitely

## 2019-02-21 NOTE — Assessment & Plan Note (Signed)
The monocyte count is stable He has mild anemia of chronic illness not requiring treatment I plan to see him back next year for follow-up He is up-to-date with influenza vaccination

## 2019-02-21 NOTE — Telephone Encounter (Signed)
Scheduled appt per 11/5 sch message - pt is aware aware of appt date and time

## 2019-02-21 NOTE — Telephone Encounter (Signed)
Called and given below message. They both verbalized understanding.

## 2019-02-21 NOTE — Progress Notes (Signed)
[facility] OFFICE PROGRESS NOTE  Patient Care Team: Rankins, Bill Salinas, MD as PCP - General (Family Medicine)  ASSESSMENT & PLAN:  CMML (chronic myelomonocytic leukemia) ([place]) The monocyte count is stable He has mild anemia of chronic illness not requiring treatment I plan to see him back next year for follow-up He is up-to-date with influenza vaccination  Anemia in chronic illness The patient also had history of anemia chronic illness and iron deficiency anemia He is not symptomatic from anemia standpoint His iron studies are good. He tolerated oral iron supplement well He will continue to take oral iron supplement indefinitely  Essential hypertension The patient is noted to have chronic hypertension but he is not taking blood pressure medication He has not seen his primary care doctor for some time We discussed the importance of risk factor modification and the importance of close follow-up with primary care doctor for possible medical management to treat his blood pressure aggressively We also discussed the importance of using home blood pressure monitoring   Orders Placed This Encounter  Procedures  . CBC with Differential    Standing Status:   Future    Standing Expiration Date:   03/27/2020  . Ferritin    Standing Status:   Future    Standing Expiration Date:   02/21/2020  . Iron and TIBC    Standing Status:   Future    Standing Expiration Date:   03/27/2020    INTERVAL HISTORY: Please see below for problem oriented charting. He returns with his wife for further follow-up He feels well No recent infection, fever or chills He is up-to-date with influenza vaccination His appetite is stable and denies weight loss He has not seen his primary care doctor since last year He tolerated oral iron supplement well The patient denies any recent signs or symptoms of bleeding such as spontaneous epistaxis, hematuria or hematochezia.   SUMMARY OF ONCOLOGIC  HISTORY: Oncology History Overview Note  Calculated IPSS-R: 4 points (cytogenetics 1 point for normal; bone marrow blasts 2 points for 5%; 1 point for hemaoglobin < 10). Overall score is in the intermediate risk with median OS 3 years and 25% AML evolution in 3 years    CMML (chronic myelomonocytic leukemia) ([place])  12/25/2015 Bone Marrow Biopsy   Accession: [id number] bone marrow biopsy is consistent with CMML, 5% blast with associated anemia.   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 Calculated IPSS-R: 4 points (cytogenetics 1 point for normal; bone marrow blasts 2 points for 5%; 1 point for hemaoglobin < 10). 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     REVIEW OF SYSTEMS:   Constitutional: Denies fevers, chills or abnormal weight loss Eyes: Denies blurriness of vision Ears, nose, mouth, throat, and face: Denies mucositis or sore throat Respiratory: Denies cough, dyspnea or wheezes Cardiovascular: Denies palpitation, chest discomfort or lower extremity swelling Gastrointestinal:  Denies nausea, heartburn or change in bowel habits Skin: Denies abnormal skin rashes Lymphatics: Denies new lymphadenopathy or easy bruising Neurological:Denies numbness, tingling or new weaknesses Behavioral/Psych: Mood is stable, no new changes  All other systems were reviewed with the patient and are negative.  I have reviewed the past medical history, past surgical history, social history and family history with the patient and they are unchanged from previous  note.  ALLERGIES:  is allergic to prednisone and tramadol.  MEDICATIONS:  Current Outpatient Medications  Medication Sig Dispense Refill  . IRON PO Take 1 capsule by mouth every other day.    . Cholecalciferol (VITAMIN D) 2000 units tablet Take 2,000 Units by mouth  daily.    . dorzolamide-timolol (COSOPT) 22.3-6.8 MG/ML ophthalmic solution Place 1 drop into both eyes 2 (two) times daily.    Marland Kitchen latanoprost (XALATAN) 0.005 % ophthalmic solution Place 1 drop into both eyes daily.    . Misc Natural Products (PROSTATE) CAPS Take 2 capsules by mouth daily.    . Multiple Vitamins-Minerals (MULTIVITAMIN & MINERAL PO) Take 1 tablet by mouth daily.    . traZODone (DESYREL) 50 MG tablet   0   No current facility-administered medications for this visit.     PHYSICAL EXAMINATION: ECOG PERFORMANCE STATUS: 0 - Asymptomatic  Vitals:   02/21/19 0901  BP: (!) 170/95  Pulse: 69  Resp: 17  Temp: 98.3 F (36.8 C)  SpO2: 100%   Filed Weights   02/21/19 0901  Weight: 167 lb 6.4 oz (75.9 kg)    GENERAL:alert, no distress and comfortable SKIN: skin color, texture, turgor are normal, no rashes or significant lesions EYES: normal, Conjunctiva are pink and non-injected, sclera clear OROPHARYNX:no exudate, no erythema and lips, buccal mucosa, and tongue normal  NECK: supple, thyroid normal size, non-tender, without nodularity LYMPH:  no palpable lymphadenopathy in the cervical, axillary or inguinal LUNGS: clear to auscultation and percussion with normal breathing effort HEART: regular rate & rhythm and no murmurs and no lower extremity edema ABDOMEN:abdomen soft, non-tender and normal bowel sounds Musculoskeletal:no cyanosis of digits and no clubbing  NEURO: alert & oriented x 3 with fluent speech, no focal motor/sensory deficits  LABORATORY DATA:  I have reviewed the data as listed    Component Value Date/Time   NA 140 08/24/2017 1049   NA 139 12/24/2015 1230   K 3.8 08/24/2017 1049   K 3.8 12/24/2015 1230   CL 108 08/24/2017 1049   CO2 27 08/24/2017 1049   CO2 23 12/24/2015 1230   GLUCOSE 107 08/24/2017 1049   GLUCOSE 124 12/24/2015 1230   BUN 29 (H) 08/24/2017 1049   BUN 21.6 12/24/2015 1230   CREATININE 1.19 08/24/2017 1049   CREATININE 0.8  12/24/2015 1230   CALCIUM 9.8 08/24/2017 1049   CALCIUM 9.5 12/24/2015 1230   PROT 7.0 12/24/2015 1230   ALBUMIN 3.0 (L) 12/24/2015 1230   AST 36 (H) 12/24/2015 1230   ALT 32 12/24/2015 1230   ALKPHOS 72 12/24/2015 1230   BILITOT 0.63 12/24/2015 1230   GFRNONAA 53 (L) 08/24/2017 1049   GFRAA >60 08/24/2017 1049    No results found for: SPEP, UPEP  Lab Results  Component Value Date   WBC 4.5 02/21/2019   NEUTROABS 2.1 02/21/2019   HGB 11.1 (L) 02/21/2019   HCT 35.5 (L) 02/21/2019   MCV 101.7 (H) 02/21/2019   PLT 312 02/21/2019      Chemistry      Component Value Date/Time   NA 140 08/24/2017 1049   NA 139 12/24/2015 1230   K 3.8 08/24/2017 1049   K 3.8 12/24/2015 1230   CL 108 08/24/2017 1049   CO2 27 08/24/2017 1049   CO2 23 12/24/2015 1230   BUN 29 (H) 08/24/2017 1049   BUN 21.6 12/24/2015 1230   CREATININE 1.19 08/24/2017 1049   CREATININE 0.8 12/24/2015 1230      Component Value  Date/Time   CALCIUM 9.8 08/24/2017 1049   CALCIUM 9.5 12/24/2015 1230   ALKPHOS 72 12/24/2015 1230   AST 36 (H) 12/24/2015 1230   ALT 32 12/24/2015 1230   BILITOT 0.63 12/24/2015 1230       All questions were answered. The patient knows to call the clinic with any problems, questions or concerns. No barriers to learning was detected.  I spent 15 minutes counseling the patient face to face. The total time spent in the appointment was 20 minutes and more than 50% was on counseling and review of test results  Heath Lark, MD 02/21/2019 10:22 AM

## 2019-02-25 DIAGNOSIS — I10 Essential (primary) hypertension: Secondary | ICD-10-CM | POA: Diagnosis not present

## 2019-03-11 ENCOUNTER — Encounter: Payer: Self-pay | Admitting: Cardiology

## 2019-03-11 DIAGNOSIS — I10 Essential (primary) hypertension: Secondary | ICD-10-CM | POA: Diagnosis not present

## 2019-03-12 ENCOUNTER — Telehealth: Payer: Self-pay | Admitting: Cardiology

## 2019-03-12 NOTE — Telephone Encounter (Signed)
Wife is insisting on coming with her husband to his appt with Dr. Harrell Gave on 03/27/19. States he is in a wheelchair.

## 2019-03-12 NOTE — Telephone Encounter (Signed)
Will route to primary-  Patient wife has been told about our policy.

## 2019-03-13 NOTE — Telephone Encounter (Signed)
Nurse spoke with wife and informed of [facility] policy. Wife agreeable and understand we will call her during appointment.   Chart noted.

## 2019-03-27 ENCOUNTER — Encounter: Payer: Self-pay | Admitting: Cardiology

## 2019-03-27 ENCOUNTER — Ambulatory Visit (INDEPENDENT_AMBULATORY_CARE_PROVIDER_SITE_OTHER): Payer: Medicare HMO | Admitting: Cardiology

## 2019-03-27 ENCOUNTER — Other Ambulatory Visit: Payer: Self-pay

## 2019-03-27 VITALS — BP 184/100 | HR 77 | Ht 72.0 in | Wt 166.0 lb

## 2019-03-27 DIAGNOSIS — Z7189 Other specified counseling: Secondary | ICD-10-CM

## 2019-03-27 DIAGNOSIS — Z79899 Other long term (current) drug therapy: Secondary | ICD-10-CM

## 2019-03-27 DIAGNOSIS — I10 Essential (primary) hypertension: Secondary | ICD-10-CM | POA: Diagnosis not present

## 2019-03-27 MED ORDER — AMLODIPINE BESYLATE 5 MG PO TABS: 5.0000 mg | ORAL_TABLET | Freq: Every day | ORAL | 11 refills | Status: DC

## 2019-03-27 NOTE — Progress Notes (Signed)
Cardiology Office Note:    Date:  03/27/2019   ID:  Stephen Jones, DOB 10-23-1928, MRN [id number]  PCP:  Aretta Nip, MD  Cardiologist:  Buford Dresser, MD  Referring MD: Aretta Nip, MD   CC: new patient evaluation for hypertension  History of Present Illness:    Stephen Jones is a 85 y.o. male with a hx of chronic kidney disease, glaucoma, hypertension who is seen as a new consult at the request of Rankins, Bill Salinas, MD for the evaluation and management of hypertension.  Office notes from 03/11/19 from Dr. Radene Ou reviewed. Patient was started on lisinopril 5 mg on 02/25/19 for new onset hypertension. His home BP log after starting this medication was 146-173/85-99, average 150s/80s. HR 56-66. BP at his in-car checkup was 178/78, HR 70. At that visit he was stopped on lisinopril 5 mg and started on amlodipine 2.5 mg daily when his Cr went from 1.19 to 1.44. He was referred to cardiology for further management.  Asked to call wife Pamala Hurry, included via conference call.   Reviewed his history of hypertension diagnosis, which he verified. Since that time, he has been checking BP at home, averaging 158/91, pulse 62. Highest in 170s, lowest in 0000000 systolic. He feels asymptomatic in general. Full ROS done below, negative. He had never been told of high blood pressure in the past but has been remarkably healthy for his age.   Denies chest pain, shortness of breath at rest or with normal exertion. No PND, orthopnea, LE edema or unexpected weight gain. No syncope or palpitations.  No family history of heart issues. Father died of pulmonary embolism.   Past Medical History:  Diagnosis Date  . Anemia   . Glaucoma   . Osteoarthritis   . Skin cancer    basal cell carcinoma    Past Surgical History:  Procedure Laterality Date  . cataracts    . SKIN SURGERY      Current Medications: Current Outpatient Medications on File Prior to Visit  Medication Sig  .  Cholecalciferol (VITAMIN D) 2000 units tablet Take 2,000 Units by mouth daily.  . dorzolamide-timolol (COSOPT) 22.3-6.8 MG/ML ophthalmic solution Place 1 drop into both eyes 2 (two) times daily.  . IRON PO Take 1 capsule by mouth every other day.  . latanoprost (XALATAN) 0.005 % ophthalmic solution Place 1 drop into both eyes daily.  . Misc Natural Products (PROSTATE) CAPS Take 2 capsules by mouth daily.  . Multiple Vitamins-Minerals (MULTIVITAMIN & MINERAL PO) Take 1 tablet by mouth daily.  . traZODone (DESYREL) 50 MG tablet    No current facility-administered medications on file prior to visit.      Allergies:   Prednisone and Tramadol   Social History   Tobacco Use  . Smoking status: Never Smoker  . Smokeless tobacco: Never Used  Substance Use Topics  . Alcohol use: No  . Drug use: No    Family History: family history includes Cancer in his mother and son.  ROS:   Please see the history of present illness.  Additional pertinent ROS: Constitutional: Negative for chills, fever, night sweats, unintentional weight loss  HENT: Negative for ear pain and hearing loss.   Eyes: Negative for loss of vision and eye pain.  Respiratory: Negative for cough, sputum, wheezing.   Cardiovascular: See HPI. Gastrointestinal: Negative for abdominal pain, melena, and hematochezia.  Genitourinary: Negative for dysuria and hematuria.  Musculoskeletal: Negative for falls and myalgias.  Skin: Negative for itching and rash.  Neurological: Negative for focal weakness, focal sensory changes and loss of consciousness.  Endo/Heme/Allergies: Does not bruise/bleed easily.     EKGs/Labs/Other Studies Reviewed:    The following studies were reviewed today: Notes from Dr. Radene Ou  EKG:  EKG is personally reviewed.  The ekg ordered today demonstrates sinus rhythm with 1st degree AV block, RBBB  Recent Labs: 02/21/2019: Hemoglobin 11.1; Platelets 312  Recent Lipid Panel No results found for: CHOL,  TRIG, HDL, CHOLHDL, VLDL, LDLCALC, LDLDIRECT  Physical Exam:    VS:  BP (!) 184/100   Pulse 77   Ht 6' (1.829 m)   Wt 166 lb (75.3 kg)   SpO2 100%   BMI 22.51 kg/m     Wt Readings from Last 3 Encounters:  03/27/19 166 lb (75.3 kg)  02/21/19 167 lb 6.4 oz (75.9 kg)  09/20/17 157 lb 6.4 oz (71.4 kg)    GEN: Well nourished, well developed in no acute distress HEENT: Normal, moist mucous membranes NECK: No JVD CARDIAC: regular rhythm, normal S1 and S2, no rubs or gallops. No murmurs. VASCULAR: Radial and DP pulses 2+ bilaterally. No carotid bruits RESPIRATORY:  Clear to auscultation without rales, wheezing or rhonchi  ABDOMEN: Soft, non-tender, non-distended MUSCULOSKELETAL:  Ambulates independently SKIN: Warm and dry, no edema NEUROLOGIC:  Alert and oriented x 3. No focal neuro deficits noted. PSYCHIATRIC:  Normal affect    ASSESSMENT:    1. Essential hypertension   2. Cardiac risk counseling   3. Counseling on health promotion and disease prevention   4. Medication management    PLAN:    Hypertension: recent diagnosis for him. Asymptomatic. Home BP suggest better control than office numbers -reviewed red flag warning signs that need immediate medical attention -reviewed his Cr change with lisinopril. This is within the 30% expected Cr elevation with initiation of an Ace inhibitor. This typically stabilizes over time and offers long term kidney protection in CKD. Therefore, would not be a contraindication to start ACEi in the future, though would monitor BMET closely -as he is already on amlodipine, will work first to optimize this. Instructions given today. Will increase to 5 mg, and if after 2 weeks average systolic BP still 99991111, would then increase to 10 mg -would avoid rapid overcorrection given his age -did counsel on common side effects of amlodipine -instructed on how to properly measure BP -bring cuff to next office visit to check against manual cuff  Cardiac risk  counseling and prevention recommendations: -recommend heart healthy/Mediterranean diet, with whole grains, fruits, vegetable, fish, lean meats, nuts, and olive oil. Limit salt. -He has limited mobility. We did discuss recommendations for at least 150 minutes/week, but any increase in activity is positive.  -with no history of ASCVD, would not screen cholesterol/start statin given age  Plan for follow up: 1 month for med titration/follow up  Medication Adjustments/Labs and Tests Ordered: Current medicines are reviewed at length with the patient today.  Concerns regarding medicines are outlined above.  No orders of the defined types were placed in this encounter.  Meds ordered this encounter  Medications  . amLODipine (NORVASC) 5 MG tablet    Sig: Take 1 tablet (5 mg total) by mouth daily.    Dispense:  30 tablet    Refill:  11    Patient Instructions  Medication Instructions:  Amlodpine Start taking 5 mg every day After 2 weeks, if average blood pressure still 99991111 systolic (top number), then increase amlodipine to 10 mg daily.  *If you  need a refill on your cardiac medications before your next appointment, please call your pharmacy*  Lab Work: None  Testing/Procedures: None  Follow-Up: At [facility], you and your health needs are our priority.  As part of our continuing mission to provide you with exceptional heart care, we have created designated Provider Care Teams.  These Care Teams include your primary Cardiologist (physician) and Advanced Practice Providers (APPs -  Physician Assistants and Nurse Practitioners) who all work together to provide you with the care you need, when you need it.  Your next appointment:   1 month(s)  The format for your next appointment:   In Person  Provider:   Buford Dresser, MD   -counseled on how to check blood pressure:  -sit comfortably in a chair, feet uncrossed and flat on floor, for 5-10 minutes  -arm ideally should  rest at the level of the heart. However, arm should be relaxed and not tense (for example, do not hold the arm up unsupported)  -avoid exercise, caffeine, and tobacco for at least 30 minutes prior to BP reading  -don't take BP cuff reading over clothes (always place on skin directly)  -I prefer to know how well the medication is working, so I would like you to take your readings 1-2 hours after taking your blood pressure medication if possible     Signed, Buford Dresser, MD PhD 03/27/2019 9:48 AM    [place]

## 2019-03-27 NOTE — Patient Instructions (Addendum)
Medication Instructions:  Amlodpine Start taking 5 mg every day After 2 weeks, if average blood pressure still 99991111 systolic (top number), then increase amlodipine to 10 mg daily.  *If you need a refill on your cardiac medications before your next appointment, please call your pharmacy*  Lab Work: None  Testing/Procedures: None  Follow-Up: At [facility], you and your health needs are our priority.  As part of our continuing mission to provide you with exceptional heart care, we have created designated Provider Care Teams.  These Care Teams include your primary Cardiologist (physician) and Advanced Practice Providers (APPs -  Physician Assistants and Nurse Practitioners) who all work together to provide you with the care you need, when you need it.  Your next appointment:   1 month(s)  The format for your next appointment:   In Person  Provider:   Buford Dresser, MD   -counseled on how to check blood pressure:  -sit comfortably in a chair, feet uncrossed and flat on floor, for 5-10 minutes  -arm ideally should rest at the level of the heart. However, arm should be relaxed and not tense (for example, do not hold the arm up unsupported)  -avoid exercise, caffeine, and tobacco for at least 30 minutes prior to BP reading  -don't take BP cuff reading over clothes (always place on skin directly)  -I prefer to know how well the medication is working, so I would like you to take your readings 1-2 hours after taking your blood pressure medication if possible

## 2019-04-10 ENCOUNTER — Telehealth: Payer: Self-pay | Admitting: Cardiology

## 2019-04-10 NOTE — Telephone Encounter (Signed)
Spoke with patient. Patient reports his blood pressure is averaging 148/85. Patient reports he has been taking 5mg  of amlodipine as discussed and is feeling fine. Patient does not want to increase to 10mg  of amlodipine at this time, he would like to wait until his appointment on 05/01/19 to discuss dosage changes.Patient just wanted to call and let Dr. Harrell Gave know how he was doing.

## 2019-04-10 NOTE — Telephone Encounter (Signed)
New message:     Patient is calling to give a up date on his medication changes, patient state he is doing good. Please call patient if any question.

## 2019-04-29 ENCOUNTER — Telehealth: Payer: Self-pay | Admitting: Cardiology

## 2019-04-29 NOTE — Telephone Encounter (Signed)
Patient is calling requesting his wife attend his upcoming appointment scheduled for 05/01/19 due to the patient being in a wheelchair and his wife knowing more about his medical history. Please advise.

## 2019-04-29 NOTE — Telephone Encounter (Signed)
Pt updated with visitor's policy and verbalized understanding. Pt will like for wife to be conference into appointment.

## 2019-05-01 ENCOUNTER — Other Ambulatory Visit: Payer: Self-pay

## 2019-05-01 ENCOUNTER — Ambulatory Visit: Payer: Medicare HMO | Admitting: Cardiology

## 2019-05-01 ENCOUNTER — Encounter: Payer: Self-pay | Admitting: Cardiology

## 2019-05-01 VITALS — BP 172/88 | HR 76 | Ht 72.0 in | Wt 168.0 lb

## 2019-05-01 DIAGNOSIS — Z7189 Other specified counseling: Secondary | ICD-10-CM

## 2019-05-01 DIAGNOSIS — Z79899 Other long term (current) drug therapy: Secondary | ICD-10-CM | POA: Diagnosis not present

## 2019-05-01 DIAGNOSIS — I10 Essential (primary) hypertension: Secondary | ICD-10-CM

## 2019-05-01 MED ORDER — AMLODIPINE BESYLATE 10 MG PO TABS: 10.0000 mg | ORAL_TABLET | Freq: Every day | ORAL | 0 refills | Status: DC

## 2019-05-01 NOTE — Progress Notes (Signed)
Cardiology Office Note:    Date:  05/01/2019   ID:  [name], DOB [birth year], MRN [id number]  PCP:  Aretta Nip, MD  Cardiologist:  Buford Dresser, MD  Referring MD: Aretta Nip, MD   CC: follow up  History of Present Illness:    Stephen Jones is a 86 y.o. male with a hx of chronic kidney disease, glaucoma, hypertension who is seen for follow up today. I initially saw him 03/27/19 as a new consult at the request of Rankins, Bill Salinas, MD for the evaluation and management of hypertension.  Today: Here with wife. Asking about zinc supplements, discussed lack of data on this today. Also discussed covid vaccine today. Discussed at length.  BP: Feeling fine, no symptoms whatsoever. Brings extensive home logs with him today.  Average SBP around 150 now, slightly improved from prior but not yet at goal. He is taking all medications as prescribed, no issues. He is amenable to uptitrating amlodipine today.  Denies chest pain, shortness of breath at rest or with normal exertion. No PND, orthopnea, LE edema or unexpected weight gain. No syncope or palpitations.  Past Medical History:  Diagnosis Date  . Anemia   . Glaucoma   . Osteoarthritis   . Skin cancer    basal cell carcinoma    Past Surgical History:  Procedure Laterality Date  . cataracts    . SKIN SURGERY      Current Medications: Current Outpatient Medications on File Prior to Visit  Medication Sig  . Cholecalciferol (VITAMIN D) 2000 units tablet Take 2,000 Units by mouth daily.  . dorzolamide-timolol (COSOPT) 22.3-6.8 MG/ML ophthalmic solution Place 1 drop into both eyes 2 (two) times daily.  . IRON PO Take 1 capsule by mouth every other day.  . latanoprost (XALATAN) 0.005 % ophthalmic solution Place 1 drop into both eyes daily.  . Misc Natural Products (PROSTATE) CAPS Take 2 capsules by mouth daily.  . Multiple Vitamins-Minerals (MULTIVITAMIN & MINERAL PO) Take 1 tablet by mouth daily.  .  traZODone (DESYREL) 50 MG tablet    No current facility-administered medications on file prior to visit.     Allergies:   Prednisone and Tramadol   Social History   Tobacco Use  . Smoking status: Never Smoker  . Smokeless tobacco: Never Used  Substance Use Topics  . Alcohol use: No  . Drug use: No    Family History: family history includes Cancer in his mother and son.  ROS:   Please see the history of present illness.  Additional pertinent ROS negative unless otherwise documented.  EKGs/Labs/Other Studies Reviewed:    The following studies were reviewed today: Notes from Dr. Radene Ou Echo 2017 EF 55-60%  EKG:  EKG is personally reviewed.  The ekg ordered 03/27/19 demonstrates sinus rhythm with 1st degree AV block, RBBB  Recent Labs: 02/21/2019: Hemoglobin 11.1; Platelets 312  Recent Lipid Panel No results found for: CHOL, TRIG, HDL, CHOLHDL, VLDL, LDLCALC, LDLDIRECT  Physical Exam:    VS:  BP (!) 172/88 (BP Location: Left Arm, Patient Position: Sitting, Cuff Size: Normal)   Pulse 76   Ht 6' (1.829 m)   Wt 168 lb (76.2 kg)   SpO2 99%   BMI 22.78 kg/m     Wt Readings from Last 3 Encounters:  05/01/19 168 lb (76.2 kg)  03/27/19 166 lb (75.3 kg)  02/21/19 167 lb 6.4 oz (75.9 kg)    GEN: Well nourished, well developed in no acute distress HEENT: Normal, moist  mucous membranes NECK: No JVD CARDIAC: regular rhythm, normal S1 and S2, no rubs or gallops. No murmur. VASCULAR: Radial and DP pulses 2+ bilaterally. No carotid bruits RESPIRATORY:  Clear to auscultation without rales, wheezing or rhonchi  ABDOMEN: Soft, non-tender, non-distended MUSCULOSKELETAL:  Ambulates independently SKIN: Warm and dry, no edema NEUROLOGIC:  Alert and oriented x 3. No focal neuro deficits noted. PSYCHIATRIC:  Normal affect   ASSESSMENT:    1. Essential hypertension   2. Medication management   3. Cardiac risk counseling    PLAN:    Hypertension: readings today (including  recheck) much higher than home readings, though home readings also not at goal -increase amlodipine to 10 mg daily today -continue home BP checks -previously had mild Cr change with lisinopril but within expected range. Not contraindiation to try Acei again, though monitor BMET carefully. -gradual titrate given his age. Reasonable to aim for 140/90 as goal rather than 130/80 given age, prior very high readings.  Cardiac risk counseling and prevention recommendations: -recommend heart healthy/Mediterranean diet, with whole grains, fruits, vegetable, fish, lean meats, nuts, and olive oil. Limit salt. -He has limited mobility, any increase in activity is positive.  -with no history of ASCVD, would not screen cholesterol/start statin given age  covid education: discussed vaccine at length today. They are interested and plan to receive as soon as they can.  Plan for follow up: 4-6 weeks for further med management  Total time of encounter: 35 minutes total time of encounter, including 25 minutes spent in face-to-face patient care. This time includes coordination of care and counseling regarding review of his BP logs with him, discussion re: management options, covid education. Remainder of non-face-to-face time involved reviewing chart documents/testing relevant to the patient encounter and documentation in the medical record.  Buford Dresser, MD, PhD [place]  [facility]   Medication Adjustments/Labs and Tests Ordered: Current medicines are reviewed at length with the patient today.  Concerns regarding medicines are outlined above.  No orders of the defined types were placed in this encounter.  Meds ordered this encounter  Medications  . amLODipine (NORVASC) 10 MG tablet    Sig: Take 1 tablet (10 mg total) by mouth daily.    Dispense:  360 tablet    Refill:  0    Patient Instructions  Medication Instructions:  Increase Amlodipine to 10 mg daily  *If you need a refill on  your cardiac medications before your next appointment, please call your pharmacy*  Lab Work: None   Testing/Procedures: None  Follow-Up: At [facility], you and your health needs are our priority.  As part of our continuing mission to provide you with exceptional heart care, we have created designated Provider Care Teams.  These Care Teams include your primary Cardiologist (physician) and Advanced Practice Providers (APPs -  Physician Assistants and Nurse Practitioners) who all work together to provide you with the care you need, when you need it.  Your next appointment:   4-6 week(s)  The format for your next appointment:   In Person  Provider:   Buford Dresser, MD      Signed, Buford Dresser, MD PhD 05/01/2019 11:19 PM    [place]

## 2019-05-01 NOTE — Patient Instructions (Signed)
Medication Instructions:  Increase Amlodipine to 10 mg daily  *If you need a refill on your cardiac medications before your next appointment, please call your pharmacy*  Lab Work: None   Testing/Procedures: None  Follow-Up: At [facility], you and your health needs are our priority.  As part of our continuing mission to provide you with exceptional heart care, we have created designated Provider Care Teams.  These Care Teams include your primary Cardiologist (physician) and Advanced Practice Providers (APPs -  Physician Assistants and Nurse Practitioners) who all work together to provide you with the care you need, when you need it.  Your next appointment:   4-6 week(s)  The format for your next appointment:   In Person  Provider:   Buford Dresser, MD

## 2019-06-04 ENCOUNTER — Telehealth: Payer: Self-pay | Admitting: Cardiology

## 2019-06-04 NOTE — Telephone Encounter (Signed)
New message  Patient is calling in to have wife approved to accompany patient to his appointment with Dr. Harrell Gave on 06/07/2019. States that he is in a wheelchair and needs wife with him at his appointment. Please call and confirm.

## 2019-06-04 NOTE — Telephone Encounter (Signed)
Pt updated with visitor's policy and verbalized understanding/

## 2019-06-07 ENCOUNTER — Telehealth: Payer: Medicare HMO | Admitting: Cardiology

## 2019-06-12 ENCOUNTER — Encounter: Payer: Self-pay | Admitting: Cardiology

## 2019-06-12 ENCOUNTER — Telehealth (INDEPENDENT_AMBULATORY_CARE_PROVIDER_SITE_OTHER): Payer: Medicare HMO | Admitting: Cardiology

## 2019-06-12 VITALS — BP 148/87 | HR 62 | Ht 72.0 in | Wt 174.0 lb

## 2019-06-12 DIAGNOSIS — Z7189 Other specified counseling: Secondary | ICD-10-CM

## 2019-06-12 DIAGNOSIS — I10 Essential (primary) hypertension: Secondary | ICD-10-CM

## 2019-06-12 DIAGNOSIS — Z79899 Other long term (current) drug therapy: Secondary | ICD-10-CM

## 2019-06-12 NOTE — Progress Notes (Signed)
Virtual Visit via Telephone Note   This visit type was conducted due to national recommendations for restrictions regarding the COVID-19 Pandemic (e.g. social distancing) in an effort to limit this patient's exposure and mitigate transmission in our community.  Due to his co-morbid illnesses, this patient is at least at moderate risk for complications without adequate follow up.  This format is felt to be most appropriate for this patient at this time.  The patient did not have access to video technology/had technical difficulties with video requiring transitioning to audio format only (telephone).  All issues noted in this document were discussed and addressed.  No physical exam could be performed with this format.  Please refer to the patient's chart for his  consent to telehealth for [facility].   Date:  06/12/2019   ID:  [name], DOB 11/30/1928, MRN [id number]  Patient Location: Home Provider Location: Home  PCP:  Aretta Nip, MD  Cardiologist:  Buford Dresser, MD  Electrophysiologist:  None   Evaluation Performed:  Follow-Up Visit  Chief Complaint:  Follow up  History of Present Illness:    Adran Eklund is a 86 y.o. male with a hx of chronic kidney disease, glaucoma, hypertension  The patient does not have symptoms concerning for COVID-19 infection (fever, chills, cough, or new shortness of breath).   Today: Sent a list of his home BP readings:   Takes amlodipine with breakfast, best BP numbers are in the afternoon. No side effects from the medication. No lightheadedness, no LE edema.  Denies chest pain, shortness of breath at rest or with normal exertion. No PND, orthopnea, LE edema or unexpected weight gain. No syncope or palpitations.  Past Medical History:  Diagnosis Date  . Anemia   . Glaucoma   . Osteoarthritis   . Skin cancer    basal cell carcinoma   Past Surgical History:  Procedure Laterality Date  . cataracts    . SKIN SURGERY         Current Meds  Medication Sig  . amLODipine (NORVASC) 10 MG tablet Take 1 tablet (10 mg total) by mouth daily.  . Cholecalciferol (VITAMIN D) 2000 units tablet Take 2,000 Units by mouth daily.  . dorzolamide-timolol (COSOPT) 22.3-6.8 MG/ML ophthalmic solution Place 1 drop into both eyes 2 (two) times daily.  . IRON PO Take 1 capsule by mouth every other day.  . latanoprost (XALATAN) 0.005 % ophthalmic solution Place 1 drop into both eyes daily.  . Misc Natural Products (PROSTATE) CAPS Take 2 capsules by mouth daily.  . Multiple Vitamins-Minerals (MULTIVITAMIN & MINERAL PO) Take 1 tablet by mouth daily.  . traZODone (DESYREL) 50 MG tablet      Allergies:   Prednisone and Tramadol   Social History   Tobacco Use  . Smoking status: Never Smoker  . Smokeless tobacco: Never Used  Substance Use Topics  . Alcohol use: No  . Drug use: No     Family Hx: The patient's family history includes Cancer in his mother and son.  ROS:   Please see the history of present illness.    All other systems reviewed and are negative.   Prior CV studies:   The following studies were reviewed today: No new since last visit  Labs/Other Tests and Data Reviewed:    EKG:  An ECG dated 03/27/19 was personally reviewed today and demonstrated:  sinus rhythm, 1st degree AV block, RBBB  Recent Labs: 02/21/2019: Hemoglobin 11.1; Platelets 312   Recent Lipid  Panel No results found for: CHOL, TRIG, HDL, CHOLHDL, LDLCALC, LDLDIRECT  Wt Readings from Last 3 Encounters:  06/12/19 174 lb (78.9 kg)  05/01/19 168 lb (76.2 kg)  03/27/19 166 lb (75.3 kg)     Objective:    Vital Signs:  BP (!) 148/87   Pulse 62   Ht 6' (1.829 m)   Wt 174 lb (78.9 kg)   BMI 23.60 kg/m    Speaking comfortably on the phone, no audible wheezing In no acute distress Alert and oriented Normal affect Normal speech  ASSESSMENT & PLAN:    Hypertension: hasn't taken AM medication yet, but home log shows very good  control. No lightheadedness, no LE edema -continue amlodipine to 10 mg daily today -continue home BP checks, though can space out if needed -previously had mild Cr change with lisinopril but within expected range. Not contraindiation to try Acei again, though monitor BMET carefully. -Reasonable to aim for 140/90 as goal rather than 130/80 given age, lack of other comorbidities  Cardiac risk counseling and prevention recommendations: -recommend heart healthy/Mediterranean diet, with whole grains, fruits, vegetable, fish, lean meats, nuts, and olive oil. Limit salt. -He has limited mobility, any increase in activity is positive. Working on this as the weather improves. -with no history of ASCVD, would not screen cholesterol/start statin given age  COVID-60 Education: The signs and symptoms of COVID-19 were discussed with the patient and how to seek care for testing (follow up with PCP or arrange E-visit).  The importance of social distancing was discussed today.  Time:   Today, I have spent 6 minutes via phone with the patient with telehealth technology discussing the above problems.  Additional time spent in review and documentation   Medication Adjustments/Labs and Tests Ordered: Current medicines are reviewed at length with the patient today.  Concerns regarding medicines are outlined above.   Tests Ordered: No orders of the defined types were placed in this encounter.   Medication Changes: No orders of the defined types were placed in this encounter.   Follow Up:  1 year  Signed, Buford Dresser, MD  06/12/2019 8:55 AM    [facility]

## 2019-06-12 NOTE — Patient Instructions (Signed)
Medication Instructions:  Your Physician recommend you continue on your current medication as directed.    *If you need a refill on your cardiac medications before your next appointment, please call your pharmacy*  Lab Work: None  Testing/Procedures: None  Follow-Up: At [facility], you and your health needs are our priority.  As part of our continuing mission to provide you with exceptional heart care, we have created designated Provider Care Teams.  These Care Teams include your primary Cardiologist (physician) and Advanced Practice Providers (APPs -  Physician Assistants and Nurse Practitioners) who all work together to provide you with the care you need, when you need it.  Your next appointment:   1 year(s)  The format for your next appointment:   Either In Person or Virtual  Provider:   Buford Dresser, MD

## 2019-07-10 ENCOUNTER — Other Ambulatory Visit: Payer: Self-pay

## 2019-07-10 DIAGNOSIS — I10 Essential (primary) hypertension: Secondary | ICD-10-CM

## 2019-07-10 MED ORDER — AMLODIPINE BESYLATE 10 MG PO TABS: 10.0000 mg | ORAL_TABLET | Freq: Every day | ORAL | 0 refills | Status: DC

## 2019-07-12 ENCOUNTER — Other Ambulatory Visit: Payer: Self-pay

## 2019-07-12 DIAGNOSIS — I10 Essential (primary) hypertension: Secondary | ICD-10-CM

## 2019-07-12 MED ORDER — AMLODIPINE BESYLATE 10 MG PO TABS: 10.0000 mg | ORAL_TABLET | Freq: Every day | ORAL | 3 refills | Status: DC

## 2019-09-10 DIAGNOSIS — H40009 Preglaucoma, unspecified, unspecified eye: Secondary | ICD-10-CM | POA: Diagnosis not present

## 2019-09-10 DIAGNOSIS — H26493 Other secondary cataract, bilateral: Secondary | ICD-10-CM | POA: Diagnosis not present

## 2019-09-10 DIAGNOSIS — H401132 Primary open-angle glaucoma, bilateral, moderate stage: Secondary | ICD-10-CM | POA: Diagnosis not present

## 2019-09-10 DIAGNOSIS — H35033 Hypertensive retinopathy, bilateral: Secondary | ICD-10-CM | POA: Diagnosis not present

## 2019-09-10 DIAGNOSIS — E7800 Pure hypercholesterolemia, unspecified: Secondary | ICD-10-CM | POA: Diagnosis not present

## 2019-09-10 DIAGNOSIS — H524 Presbyopia: Secondary | ICD-10-CM | POA: Diagnosis not present

## 2019-09-30 ENCOUNTER — Telehealth: Payer: Self-pay

## 2019-09-30 NOTE — Telephone Encounter (Signed)
-----   Message from Heath Lark, MD sent at 09/30/2019 10:57 AM EDT ----- Regarding: appt 6/24 Can he come in 6/23? He needs labs

## 2019-09-30 NOTE — Telephone Encounter (Signed)
Called back and given below message. Appts moved to 6/23. He is aware of times.

## 2019-09-30 NOTE — Telephone Encounter (Signed)
Called and left a message below message. Ask him to call the office back.

## 2019-10-09 ENCOUNTER — Inpatient Hospital Stay: Payer: Medicare HMO | Attending: Hematology and Oncology | Admitting: Hematology and Oncology

## 2019-10-09 ENCOUNTER — Other Ambulatory Visit: Payer: Self-pay

## 2019-10-09 ENCOUNTER — Other Ambulatory Visit: Payer: Medicare HMO

## 2019-10-09 ENCOUNTER — Inpatient Hospital Stay: Payer: Medicare HMO

## 2019-10-09 ENCOUNTER — Encounter: Payer: Self-pay | Admitting: Hematology and Oncology

## 2019-10-09 VITALS — BP 161/81 | HR 74 | Temp 97.9°F | Resp 18 | Ht 72.0 in | Wt 152.2 lb

## 2019-10-09 DIAGNOSIS — C9310 Chronic myelomonocytic leukemia not having achieved remission: Secondary | ICD-10-CM | POA: Insufficient documentation

## 2019-10-09 DIAGNOSIS — D509 Iron deficiency anemia, unspecified: Secondary | ICD-10-CM | POA: Insufficient documentation

## 2019-10-09 DIAGNOSIS — I10 Essential (primary) hypertension: Secondary | ICD-10-CM | POA: Insufficient documentation

## 2019-10-09 DIAGNOSIS — D539 Nutritional anemia, unspecified: Secondary | ICD-10-CM | POA: Diagnosis not present

## 2019-10-09 DIAGNOSIS — Z79899 Other long term (current) drug therapy: Secondary | ICD-10-CM | POA: Diagnosis not present

## 2019-10-09 DIAGNOSIS — D638 Anemia in other chronic diseases classified elsewhere: Secondary | ICD-10-CM | POA: Diagnosis not present

## 2019-10-09 LAB — CBC WITH DIFFERENTIAL/PLATELET
Abs Immature Granulocytes: 0.03 10*3/uL (ref 0.00–0.07)
Basophils Absolute: 0 10*3/uL (ref 0.0–0.1)
Basophils Relative: 0 %
Eosinophils Absolute: 0.1 10*3/uL (ref 0.0–0.5)
Eosinophils Relative: 1 %
HCT: 34.3 % — ABNORMAL LOW (ref 39.0–52.0)
Hemoglobin: 10.8 g/dL — ABNORMAL LOW (ref 13.0–17.0)
Immature Granulocytes: 1 %
Lymphocytes Relative: 20 %
Lymphs Abs: 1 10*3/uL (ref 0.7–4.0)
MCH: 31.3 pg (ref 26.0–34.0)
MCHC: 31.5 g/dL (ref 30.0–36.0)
MCV: 99.4 fL (ref 80.0–100.0)
Monocytes Absolute: 1.4 10*3/uL — ABNORMAL HIGH (ref 0.1–1.0)
Monocytes Relative: 26 %
Neutro Abs: 2.8 10*3/uL (ref 1.7–7.7)
Neutrophils Relative %: 52 %
Platelets: 409 10*3/uL — ABNORMAL HIGH (ref 150–400)
RBC: 3.45 MIL/uL — ABNORMAL LOW (ref 4.22–5.81)
RDW: 15.8 % — ABNORMAL HIGH (ref 11.5–15.5)
WBC: 5.3 10*3/uL (ref 4.0–10.5)
nRBC: 0 % (ref 0.0–0.2)

## 2019-10-09 LAB — IRON AND TIBC
Iron: 69 ug/dL (ref 42–163)
Saturation Ratios: 19 % — ABNORMAL LOW (ref 20–55)
TIBC: 364 ug/dL (ref 202–409)
UIBC: 295 ug/dL (ref 117–376)

## 2019-10-09 LAB — FERRITIN: Ferritin: 86 ng/mL (ref 24–336)

## 2019-10-09 NOTE — Assessment & Plan Note (Signed)
The patient also had history of anemia chronic illness and iron deficiency anemia He is not symptomatic from anemia standpoint He tolerated oral iron supplement well He will continue to take oral iron supplement indefinitely

## 2019-10-09 NOTE — Assessment & Plan Note (Signed)
The monocyte count is stable He has mild anemia of chronic illness not requiring treatment I plan to see him back next year for follow-up

## 2019-10-09 NOTE — Progress Notes (Signed)
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 CBC with Differential/Platelet    Standing Status:   Standing    Number of Occurrences:   22    Standing Expiration Date:   10/08/2020  . Ferritin    Standing Status:   Future    Standing Expiration Date:   10/08/2020  . Iron and TIBC    Standing Status:   Future    Standing Expiration Date:   10/08/2020    All questions were answered. The patient knows to call the clinic with any problems, questions or concerns. The total time spent in the appointment was 15 minutes encounter with patients including review of chart and various tests results, discussions about plan of care and coordination of care plan   [facility] Lark, MD 10/09/2019 10:56 AM  INTERVAL HISTORY: Please see below for problem oriented charting. He returns with his wife for further follow-up He is feeling fine The patient denies any recent signs or symptoms of bleeding such as spontaneous epistaxis, hematuria or hematochezia. He tolerated oral iron supplement well  SUMMARY OF ONCOLOGIC HISTORY: Oncology History Overview Note  Calculated IPSS-R: 4 points (cytogenetics 1 point for normal; bone marrow  blasts 2 points for 5%; 1 point for hemaoglobin < 10). 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 Calculated IPSS-R: 4 points (cytogenetics 1 point for normal; bone marrow blasts 2 points for 5%; 1 point for hemaoglobin < 10). 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     REVIEW OF SYSTEMS:   Constitutional: Denies fevers, chills or abnormal weight loss Eyes: Denies blurriness of vision Ears, nose, mouth, throat, and face: Denies mucositis or sore throat Respiratory: Denies cough, dyspnea or wheezes Cardiovascular: Denies palpitation, chest discomfort or lower extremity swelling Gastrointestinal:  Denies nausea, heartburn or change in bowel habits Skin: Denies abnormal skin rashes Lymphatics: Denies new lymphadenopathy or easy bruising Neurological:Denies numbness, tingling or new weaknesses Behavioral/Psych: Mood is stable, no new changes  All other systems were reviewed with the patient and are negative.  I have reviewed the past medical history, past surgical history, social history and family history with the patient and they are unchanged from previous note.  ALLERGIES:  is allergic to prednisone and tramadol.  MEDICATIONS:  Current Outpatient Medications  Medication  Sig Dispense Refill  . amLODipine (NORVASC) 10 MG tablet Take 1 tablet (10 mg total) by mouth daily. 90 tablet 3  . Cholecalciferol (VITAMIN D) 2000 units tablet Take 2,000 Units by mouth daily.    . dorzolamide-timolol (COSOPT) 22.3-6.8 MG/ML ophthalmic solution  Place 1 drop into both eyes 2 (two) times daily.    . IRON PO Take 1 capsule by mouth every other day.    . latanoprost (XALATAN) 0.005 % ophthalmic solution Place 1 drop into both eyes daily.    . Misc Natural Products (PROSTATE) CAPS Take 2 capsules by mouth daily.    . Multiple Vitamins-Minerals (MULTIVITAMIN & MINERAL PO) Take 1 tablet by mouth daily.    . traZODone (DESYREL) 50 MG tablet   0   No current facility-administered medications for this visit.    PHYSICAL EXAMINATION: ECOG PERFORMANCE STATUS: 2 - Symptomatic, <50% confined to bed  Vitals:   10/09/19 1032  BP: (!) 161/81  Pulse: 74  Resp: 18  Temp: 97.9 F (36.6 C)  SpO2: 97%   Filed Weights   10/09/19 1032  Weight: 152 lb 3.2 oz (69 kg)    GENERAL:alert, no distress and comfortable NEURO: alert & oriented x 3 with fluent speech, no focal motor/sensory deficits  LABORATORY DATA:  I have reviewed the data as listed    Component Value Date/Time   NA 140 08/24/2017 1049   NA 139 12/24/2015 1230   K 3.8 08/24/2017 1049   K 3.8 12/24/2015 1230   CL 108 08/24/2017 1049   CO2 27 08/24/2017 1049   CO2 23 12/24/2015 1230   GLUCOSE 107 08/24/2017 1049   GLUCOSE 124 12/24/2015 1230   BUN 29 (H) 08/24/2017 1049   BUN 21.6 12/24/2015 1230   CREATININE 1.19 08/24/2017 1049   CREATININE 0.8 12/24/2015 1230   CALCIUM 9.8 08/24/2017 1049   CALCIUM 9.5 12/24/2015 1230   PROT 7.0 12/24/2015 1230   ALBUMIN 3.0 (L) 12/24/2015 1230   AST 36 (H) 12/24/2015 1230   ALT 32 12/24/2015 1230   ALKPHOS 72 12/24/2015 1230   BILITOT 0.63 12/24/2015 1230   GFRNONAA 53 (L) 08/24/2017 1049   GFRAA >60 08/24/2017 1049    No results found for: SPEP, UPEP  Lab Results  Component Value Date   WBC 5.3 10/09/2019   NEUTROABS 2.8 10/09/2019   HGB 10.8 (L) 10/09/2019   HCT 34.3 (L) 10/09/2019   MCV 99.4 10/09/2019   PLT 409 (H) 10/09/2019      Chemistry      Component Value Date/Time   NA 140 08/24/2017 1049   NA 139  12/24/2015 1230   K 3.8 08/24/2017 1049   K 3.8 12/24/2015 1230   CL 108 08/24/2017 1049   CO2 27 08/24/2017 1049   CO2 23 12/24/2015 1230   BUN 29 (H) 08/24/2017 1049   BUN 21.6 12/24/2015 1230   CREATININE 1.19 08/24/2017 1049   CREATININE 0.8 12/24/2015 1230      Component Value Date/Time   CALCIUM 9.8 08/24/2017 1049   CALCIUM 9.5 12/24/2015 1230   ALKPHOS 72 12/24/2015 1230   AST 36 (H) 12/24/2015 1230   ALT 32 12/24/2015 1230   BILITOT 0.63 12/24/2015 1230

## 2019-10-09 NOTE — Assessment & Plan Note (Signed)
The patient is noted to intermittent chronic hypertension I recommend close follow-up with primary care doctor

## 2019-10-10 ENCOUNTER — Other Ambulatory Visit: Payer: Medicare HMO

## 2019-10-10 ENCOUNTER — Ambulatory Visit: Payer: Medicare HMO | Admitting: Hematology and Oncology

## 2019-10-14 ENCOUNTER — Other Ambulatory Visit: Payer: Medicare HMO

## 2019-10-14 ENCOUNTER — Ambulatory Visit: Payer: Medicare HMO | Admitting: Hematology and Oncology

## 2019-10-14 ENCOUNTER — Telehealth: Payer: Self-pay | Admitting: Hematology and Oncology

## 2019-10-14 NOTE — Telephone Encounter (Signed)
Scheduled appts per 6/23 sch msg. Left voicemail with appt date and time.

## 2020-01-09 DIAGNOSIS — H401131 Primary open-angle glaucoma, bilateral, mild stage: Secondary | ICD-10-CM | POA: Diagnosis not present

## 2020-02-19 DIAGNOSIS — Z0000 Encounter for general adult medical examination without abnormal findings: Secondary | ICD-10-CM | POA: Diagnosis not present

## 2020-02-19 DIAGNOSIS — C9310 Chronic myelomonocytic leukemia not having achieved remission: Secondary | ICD-10-CM | POA: Diagnosis not present

## 2020-02-19 DIAGNOSIS — G479 Sleep disorder, unspecified: Secondary | ICD-10-CM | POA: Diagnosis not present

## 2020-02-19 DIAGNOSIS — R5381 Other malaise: Secondary | ICD-10-CM | POA: Diagnosis not present

## 2020-02-19 DIAGNOSIS — Z23 Encounter for immunization: Secondary | ICD-10-CM | POA: Diagnosis not present

## 2020-02-19 DIAGNOSIS — I10 Essential (primary) hypertension: Secondary | ICD-10-CM | POA: Diagnosis not present

## 2020-04-01 ENCOUNTER — Telehealth: Payer: Self-pay | Admitting: Cardiology

## 2020-04-01 DIAGNOSIS — I10 Essential (primary) hypertension: Secondary | ICD-10-CM

## 2020-04-01 MED ORDER — AMLODIPINE BESYLATE 10 MG PO TABS
10.0000 mg | ORAL_TABLET | Freq: Every day | ORAL | 0 refills | Status: AC
Start: 2020-04-01 — End: 2021-03-27

## 2020-04-01 NOTE — Telephone Encounter (Signed)
Medication refilled

## 2020-04-01 NOTE — Telephone Encounter (Signed)
°*  STAT* If patient is at the pharmacy, call can be transferred to refill team.   1. Which medications need to be refilled? (please list name of each medication and dose if known)  amLODipine (NORVASC) 10 MG tablet  2. Which pharmacy/location (including street and city if local pharmacy) is medication to be sent to? [place], [place] - [address]  3. Do they need a 30 day or 90 day supply? 90  Patient is going to run out of meds on 04/23/19 and wants to make sure he's able to get his refill before his appt.

## 2020-06-02 ENCOUNTER — Encounter (HOSPITAL_COMMUNITY): Payer: Self-pay | Admitting: Emergency Medicine

## 2020-06-02 ENCOUNTER — Emergency Department (HOSPITAL_COMMUNITY): Payer: Medicare HMO

## 2020-06-02 ENCOUNTER — Emergency Department (HOSPITAL_COMMUNITY)
Admission: EM | Admit: 2020-06-02 | Discharge: 2020-06-16 | Disposition: E | Payer: Medicare HMO | Attending: Emergency Medicine | Admitting: Emergency Medicine

## 2020-06-02 ENCOUNTER — Other Ambulatory Visit: Payer: Self-pay

## 2020-06-02 DIAGNOSIS — I469 Cardiac arrest, cause unspecified: Secondary | ICD-10-CM | POA: Insufficient documentation

## 2020-06-02 DIAGNOSIS — Z85828 Personal history of other malignant neoplasm of skin: Secondary | ICD-10-CM | POA: Insufficient documentation

## 2020-06-02 DIAGNOSIS — I499 Cardiac arrhythmia, unspecified: Secondary | ICD-10-CM | POA: Diagnosis not present

## 2020-06-02 DIAGNOSIS — I10 Essential (primary) hypertension: Secondary | ICD-10-CM | POA: Diagnosis not present

## 2020-06-02 DIAGNOSIS — Z79899 Other long term (current) drug therapy: Secondary | ICD-10-CM | POA: Diagnosis not present

## 2020-06-02 DIAGNOSIS — R0689 Other abnormalities of breathing: Secondary | ICD-10-CM | POA: Diagnosis not present

## 2020-06-02 DIAGNOSIS — R4020 Unspecified coma: Secondary | ICD-10-CM | POA: Diagnosis not present

## 2020-06-02 DIAGNOSIS — R0902 Hypoxemia: Secondary | ICD-10-CM | POA: Diagnosis not present

## 2020-06-02 DIAGNOSIS — N179 Acute kidney failure, unspecified: Secondary | ICD-10-CM | POA: Diagnosis not present

## 2020-06-02 DIAGNOSIS — R404 Transient alteration of awareness: Secondary | ICD-10-CM | POA: Diagnosis not present

## 2020-06-02 LAB — I-STAT CHEM 8, ED
BUN: 54 mg/dL — ABNORMAL HIGH (ref 8–23)
Calcium, Ion: 1.33 mmol/L (ref 1.15–1.40)
Chloride: 110 mmol/L (ref 98–111)
Creatinine, Ser: 3.3 mg/dL — ABNORMAL HIGH (ref 0.61–1.24)
Glucose, Bld: 69 mg/dL — ABNORMAL LOW (ref 70–99)
HCT: 33 % — ABNORMAL LOW (ref 39.0–52.0)
Hemoglobin: 11.2 g/dL — ABNORMAL LOW (ref 13.0–17.0)
Potassium: 4.6 mmol/L (ref 3.5–5.1)
Sodium: 140 mmol/L (ref 135–145)
TCO2: 12 mmol/L — ABNORMAL LOW (ref 22–32)

## 2020-06-02 LAB — COMPREHENSIVE METABOLIC PANEL
ALT: 132 U/L — ABNORMAL HIGH (ref 0–44)
AST: 177 U/L — ABNORMAL HIGH (ref 15–41)
Albumin: 3.6 g/dL (ref 3.5–5.0)
Alkaline Phosphatase: 113 U/L (ref 38–126)
BUN: 48 mg/dL — ABNORMAL HIGH (ref 8–23)
CO2: <7 mmol/L — ABNORMAL LOW (ref 22–32)
Calcium: 11.6 mg/dL — ABNORMAL HIGH (ref 8.9–10.3)
Chloride: 105 mmol/L (ref 98–111)
Creatinine, Ser: 3.78 mg/dL — ABNORMAL HIGH (ref 0.61–1.24)
GFR, Estimated: 15 mL/min — ABNORMAL LOW (ref >60)
Glucose, Bld: 72 mg/dL (ref 70–99)
Potassium: 5 mmol/L (ref 3.5–5.1)
Sodium: 145 mmol/L (ref 135–145)
Total Bilirubin: 0.8 mg/dL (ref 0.3–1.2)
Total Protein: 6.1 g/dL — ABNORMAL LOW (ref 6.5–8.1)

## 2020-06-02 LAB — CBC WITH DIFFERENTIAL/PLATELET
Abs Immature Granulocytes: 5.7 10*3/uL — ABNORMAL HIGH (ref 0.00–0.07)
Basophils Absolute: 0.2 10*3/uL — ABNORMAL HIGH (ref 0.0–0.1)
Basophils Relative: 0 %
Eosinophils Absolute: 0.3 10*3/uL (ref 0.0–0.5)
Eosinophils Relative: 0 %
HCT: 38.7 % — ABNORMAL LOW (ref 39.0–52.0)
Hemoglobin: 10.2 g/dL — ABNORMAL LOW (ref 13.0–17.0)
Immature Granulocytes: 9 %
Lymphocytes Relative: 6 %
Lymphs Abs: 3.5 10*3/uL (ref 0.7–4.0)
MCH: 31.8 pg (ref 26.0–34.0)
MCHC: 26.4 g/dL — ABNORMAL LOW (ref 30.0–36.0)
MCV: 120.6 fL — ABNORMAL HIGH (ref 80.0–100.0)
Monocytes Absolute: 8.9 10*3/uL — ABNORMAL HIGH (ref 0.1–1.0)
Monocytes Relative: 14 %
Neutro Abs: 43 10*3/uL — ABNORMAL HIGH (ref 1.7–7.7)
Neutrophils Relative %: 71 %
Platelets: 327 10*3/uL (ref 150–400)
RBC: 3.21 MIL/uL — ABNORMAL LOW (ref 4.22–5.81)
RDW: 15 % (ref 11.5–15.5)
WBC: 61.6 10*3/uL (ref 4.0–10.5)
nRBC: 0.1 % (ref 0.0–0.2)

## 2020-06-02 LAB — I-STAT BETA HCG BLOOD, ED (MC, WL, AP ONLY): I-stat hCG, quantitative: 7.4 m[IU]/mL — ABNORMAL HIGH (ref <5)

## 2020-06-02 LAB — BRAIN NATRIURETIC PEPTIDE: B Natriuretic Peptide: 620.4 pg/mL — ABNORMAL HIGH (ref 0.0–100.0)

## 2020-06-02 LAB — TROPONIN I (HIGH SENSITIVITY): Troponin I (High Sensitivity): 234 ng/L (ref <18)

## 2020-06-11 ENCOUNTER — Ambulatory Visit: Payer: Medicare HMO | Admitting: Cardiology

## 2020-06-16 NOTE — ED Triage Notes (Signed)
Patient fell out of bed, speaking to wife and then went unresponsive.  Patient was found apneic and pulseless by FD and EMS.  ROSC was obtained 3 times and lost en route to ED.  1st CPR started at 0423 and ROSC lost a third time at 0520.  Patient went back and forth from asystole and PEA. Patient with copious amounts of black, bloody vomitus.  Capnography of 36-40 en route.  CBG 162.

## 2020-06-16 NOTE — ED Notes (Signed)
Patient with no pulse on monitor, ultrasound at bedside.  Dr Christy Gentles at bedside. No cardiac acitivity, TOD called.

## 2020-06-16 NOTE — ED Provider Notes (Signed)
[facility] EMERGENCY DEPARTMENT Provider Note   CSN: [id number] Arrival date & time: 2020-06-14  0542     History Chief Complaint  Patient presents with  . Cardiac Arrest   Level  5 caveat due to unresponsiveness Stephen Jones is a 87 y.o. male.  HPI Patient presents from home after being found unresponsive.  Patient apparently fell out of bed and when wife went to check on him he was unresponsive.  Patient was found apneic and pulseless by first responders.  Patient was intubated by first responders.  Patient had a prolonged course of CPR, but did have return of spontaneous circulation.  Patient has received multiple doses of epinephrine.  Patient was found to be in asystole and PEA.  Patient was never defibrillated.  He did have a large amount of black emesis prior to arrival.    Past Medical History:  Diagnosis Date  . Anemia   . Glaucoma   . Osteoarthritis   . Skin cancer    basal cell carcinoma    Patient Active Problem List   Diagnosis Date Noted  . Essential hypertension 04/19/2016  . Anemia in neoplastic disease 01/12/2016  . 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 Deficiency anemia 12/31/2015  . Preventive measure 12/31/2015  . Iron deficiency anemia 12/31/2015  . Recurrent fever 12/24/2015  . Abnormal weight loss 11/01/2015  . Chronic pain of multiple joints 11/01/2015  . Anemia in chronic illness 11/01/2015  . Monocytosis 10/30/2015    Past Surgical History:  Procedure Laterality Date  . cataracts    . SKIN SURGERY         Family History  Problem Relation Age of Onset  . Cancer Mother        colon cancer  . Cancer Son        hodgkin    Social History   Tobacco Use  . Smoking status: Never Smoker  . Smokeless tobacco: Never Used  Substance Use Topics  . Alcohol use: No  . Drug use: No    Home Medications Prior to Admission medications   Medication Sig Start Date End Date Taking? Authorizing Provider   amLODipine (NORVASC) 10 MG tablet Take 1 tablet (10 mg total) by mouth daily. 04/01/20 03/27/21  Buford Dresser, MD  Cholecalciferol (VITAMIN D) 2000 units tablet Take 2,000 Units by mouth daily.    [provider]  dorzolamide-timolol (COSOPT) 22.3-6.8 MG/ML ophthalmic solution Place 1 drop into both eyes 2 (two) times daily. 10/14/15   [provider]  IRON PO Take 1 capsule by mouth every other day.    [provider]  latanoprost (XALATAN) 0.005 % ophthalmic solution Place 1 drop into both eyes daily.    [provider]  Misc Natural Products (PROSTATE) CAPS Take 2 capsules by mouth daily.    [provider]  Multiple Vitamins-Minerals (MULTIVITAMIN & MINERAL PO) Take 1 tablet by mouth daily.    [provider]  traZODone (DESYREL) 50 MG tablet  07/31/17   [provider]    Allergies    Prednisone and Tramadol  Review of Systems   Review of Systems  Unable to perform ROS: Patient unresponsive    Physical Exam Updated Vital Signs There were no vitals taken for this visit.  Physical Exam CONSTITUTIONAL: Unresponsive HEAD: Normocephalic/atraumatic EYES: Pupils fixed and dilated ENMT: ET tube in place, large amount of black vomit on his face NECK: supple no meningeal signs SPINE/BACK: No deformities or step-off CV: No  heart sounds noted LUNGS: Coarse breath sounds bilaterally with bagging ABDOMEN: soft GU: Large bilateral inguinal hernias noted NEURO: Pt is unresponsive EXTREMITIES: Bilateral intraosseous lines in place SKIN: Cold to touch  ED Results / Procedures / Treatments   Labs (all labs ordered are listed, but only abnormal results are displayed) Labs Reviewed  I-STAT CHEM 8, ED - Abnormal; Notable for the following components:      Result Value   BUN 54 (*)    Creatinine, Ser 3.30 (*)    Glucose, Bld 69 (*)    TCO2 12 (*)    Hemoglobin 11.2 (*)    HCT 33.0 (*)    All other components  within normal limits  I-STAT BETA HCG BLOOD, ED ([facility], [facility], [facility] ONLY) - Abnormal; Notable for the following components:   I-stat hCG, quantitative 7.4 (*)    All other components within normal limits  TROPONIN I (HIGH SENSITIVITY) - Abnormal; Notable for the following components:   Troponin I (High Sensitivity) 234 (*)    All other components within normal limits  CBC WITH DIFFERENTIAL/PLATELET  COMPREHENSIVE METABOLIC PANEL  BRAIN NATRIURETIC PEPTIDE    EKG None  Radiology No results found.  Procedures Procedures    CPR Procedure Note I PERSONALLY DIRECTED ANCILLARY STAFF OR/PERFORMED CPR IN AN EFFORT TO REGAIN RETURN OF SPONTANEOUS CIRCULATION IN AN EFFORT TO MAINTAIN NEURO, CARDIAC AND SYSTEMIC PERFUSION  Medications Ordered in ED Medications - No data to display  ED Course  I have reviewed the triage vital signs and the nursing notes.  MDM Rules/Calculators/A&P                          Patient arrived via EMS in cardiac arrest.  He had prolonged resuscitation in the field, and did have ROSC at 1 point.  On arrival he was receiving compressions.  He had already been intubated.  Pulse checks were difficult as he had large inguinal hernias and obscured femoral pulse check.  He had no carotid pulse. Bedside ultrasound did reveal cardiac activity, therefore patient was placed on the ventilator as were going to continue resuscitation.  Soon after, patient went asystolic without any spontaneous cardiac activity. Patient likely suffered severe anoxic brain injury after prolonged resuscitation. Time of death 6 AM. 7:16 AM Discussed with patient's wife and daughter with chaplain  They were updated on patient's course Patient's PCP is [place] Rankins and death cert can be sent there Final Clinical Impression(s) / ED Diagnoses Final diagnoses:  Cardiac arrest ([place])  AKI (acute kidney injury) [facility])    Rx / [place] Orders ED Discharge Orders    None       Ripley Fraise,  MD 2020/06/26 [phone]

## 2020-06-16 NOTE — Progress Notes (Signed)
   July 02, 2020 0631  Clinical Encounter Type  Visited With Patient and family together  Visit Type Death  Referral From Nurse  Consult/Referral To Chaplain  Spiritual Encounters  Spiritual Needs Prayer;Grief support   [facility]: [place] responded to page. Pt's wife, Pamala Hurry, and daughter, Hinton Dyer, were present in Consult Room A. Chaplain accompanied Dr. Christy Gentles while he informed them of Pt's passing. Chaplain engaged in active listening and provided grief and spiritual support. Chaplain prayed with Pt's wife and daughter at bedside. Chaplain remained with Pt's family until they left the hospital. Chaplain remains available.  This note was prepared by Chaplain Resident, Dante Gang, MDiv. Chaplain remains available as needed through the on-call pager: [phone].

## 2020-06-16 DEATH — deceased

## 2020-10-08 ENCOUNTER — Other Ambulatory Visit: Payer: Medicare HMO

## 2020-10-08 ENCOUNTER — Ambulatory Visit: Payer: Medicare HMO | Admitting: Hematology and Oncology
